# Patient Record
Sex: Female | Born: 1960 | Race: White | Hispanic: No | Marital: Married | Smoking: Former smoker
Health system: Southern US, Community
[De-identification: ages and names within clinical notes are randomized; demographics above are authoritative.]

## PROBLEM LIST (undated history)

## (undated) DIAGNOSIS — K219 Gastro-esophageal reflux disease without esophagitis: Secondary | ICD-10-CM

## (undated) DIAGNOSIS — I10 Essential (primary) hypertension: Secondary | ICD-10-CM

## (undated) HISTORY — PX: ABDOMINAL HYSTERECTOMY: SHX81

## (undated) HISTORY — DX: Gastro-esophageal reflux disease without esophagitis: K21.9

## (undated) HISTORY — PX: TUBAL LIGATION: SHX77

## (undated) HISTORY — PX: TOTAL ABDOMINAL HYSTERECTOMY: SHX209

## (undated) HISTORY — DX: Essential (primary) hypertension: I10

---

## 2007-03-06 ENCOUNTER — Ambulatory Visit: Payer: Self-pay | Admitting: Obstetrics and Gynecology

## 2007-07-23 ENCOUNTER — Ambulatory Visit: Payer: Self-pay | Admitting: Obstetrics and Gynecology

## 2007-12-27 ENCOUNTER — Other Ambulatory Visit: Payer: Self-pay

## 2007-12-27 ENCOUNTER — Ambulatory Visit: Payer: Self-pay | Admitting: Obstetrics and Gynecology

## 2007-12-31 ENCOUNTER — Inpatient Hospital Stay: Payer: Self-pay | Admitting: Obstetrics and Gynecology

## 2009-04-18 IMAGING — US US PELV - US TRANSVAGINAL
1 series · 17 of 25 positions shown · non-contrast
Comparison: none

REASON FOR EXAM: adnexal mass
COMMENTS:

[Series 1: us pelv - us transvaginal · 17 of 34 slices shown]
[im 1/34]
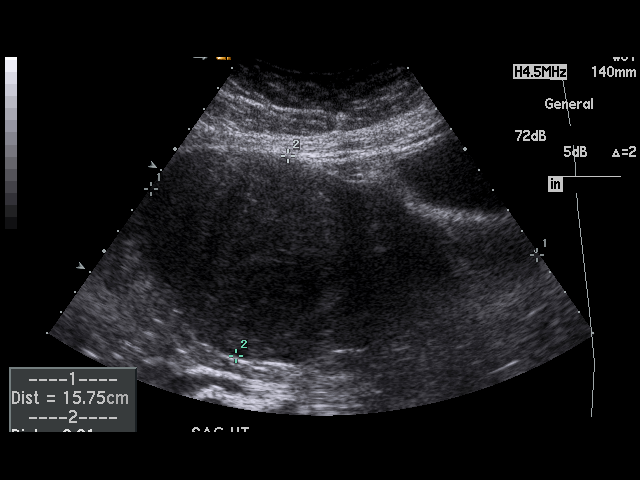
[im 3/34]
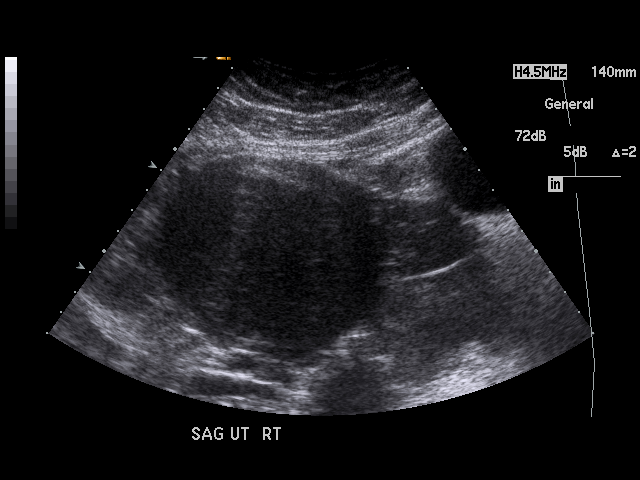
[im 5/34]
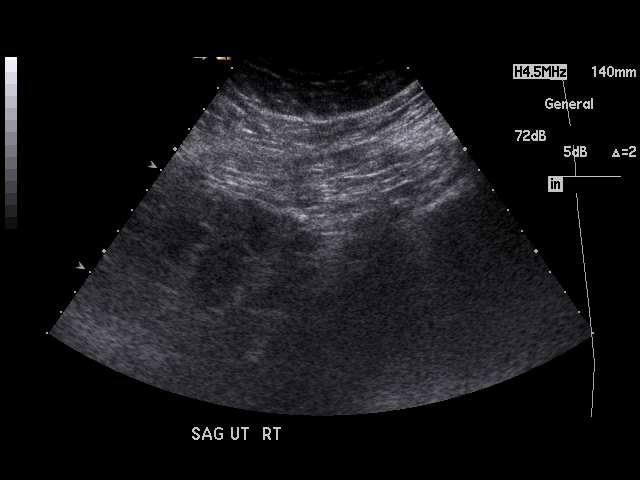
[im 7/34]
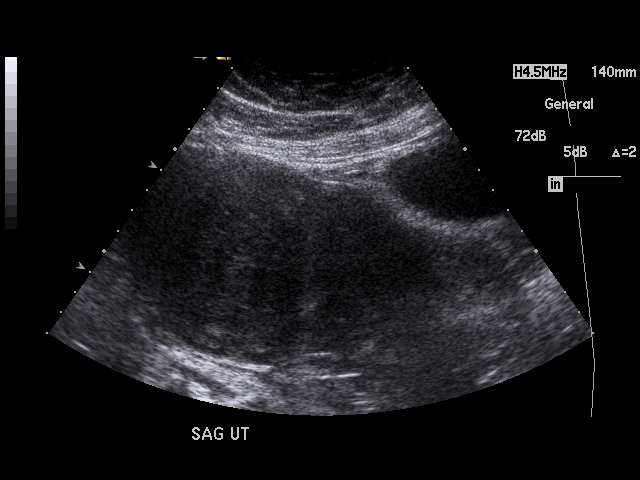
[im 9/34]
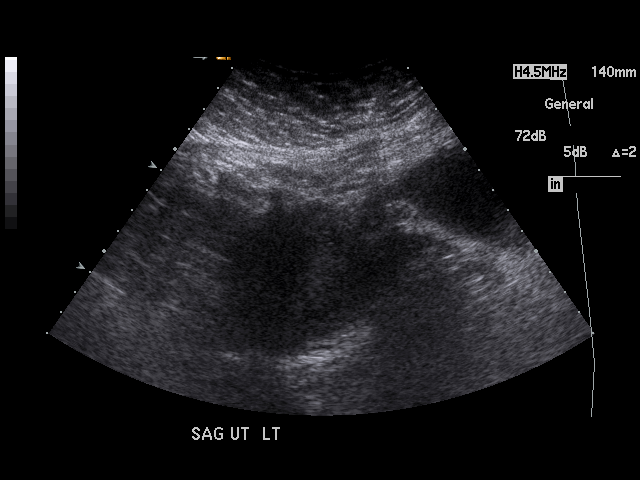
[im 12/34]
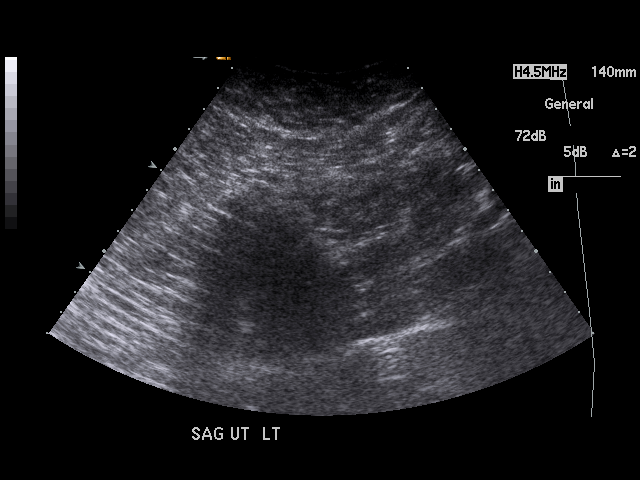
[im 13/34]
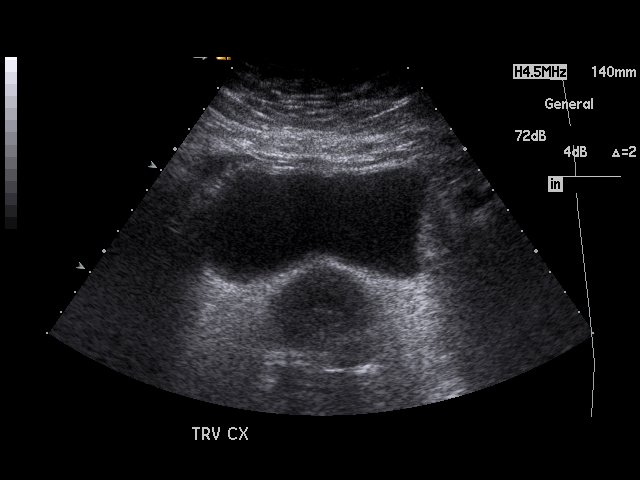
[im 16/34]
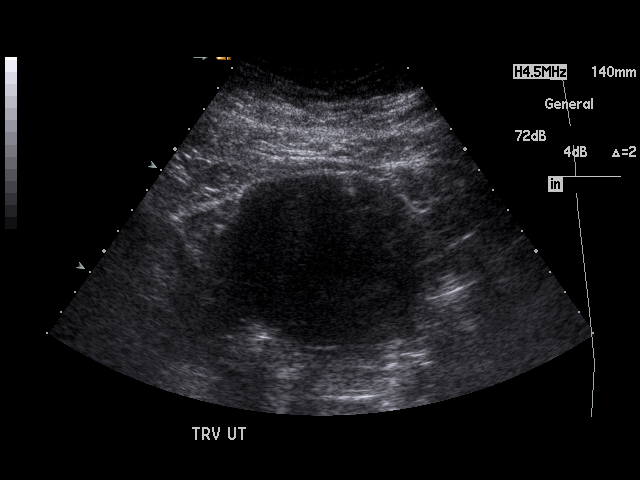
[im 17/34]
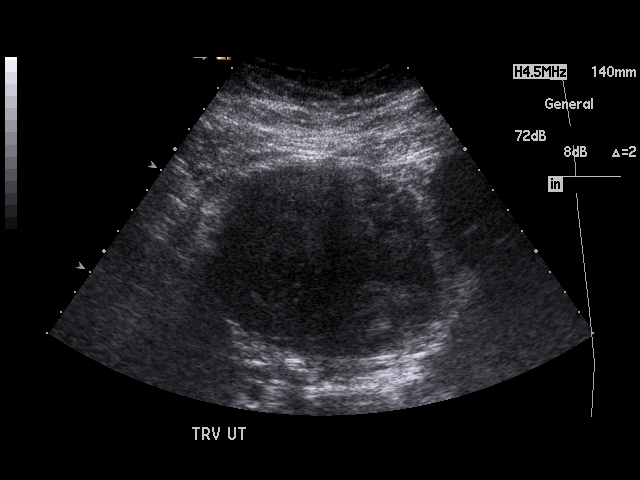
[im 18/34]
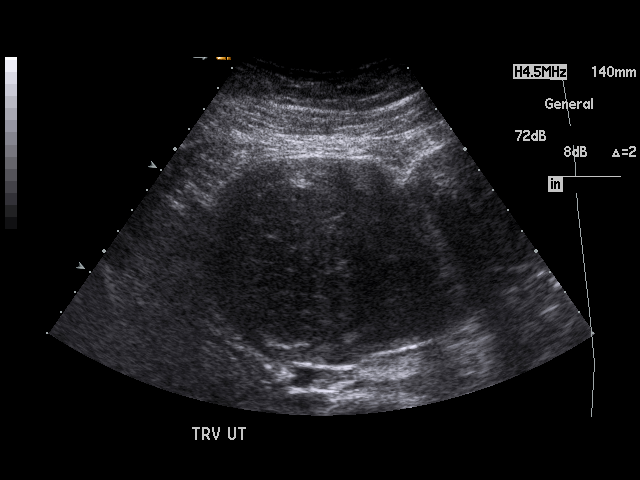
[im 21/34]
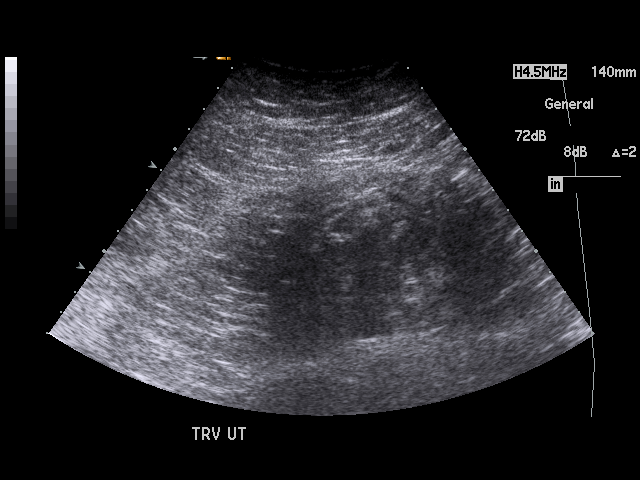
[im 23/34]
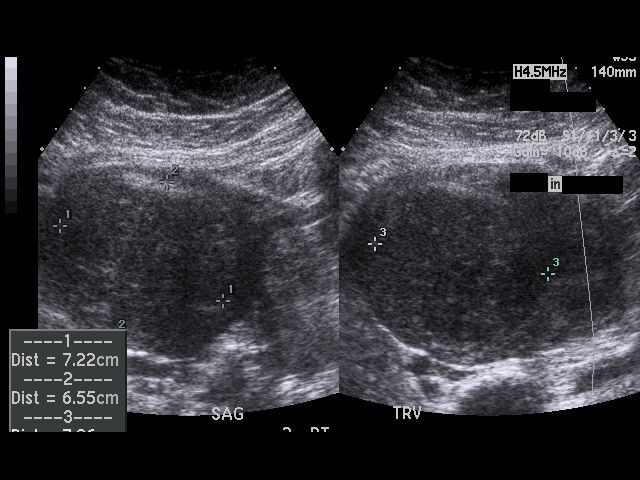
[im 25/34]
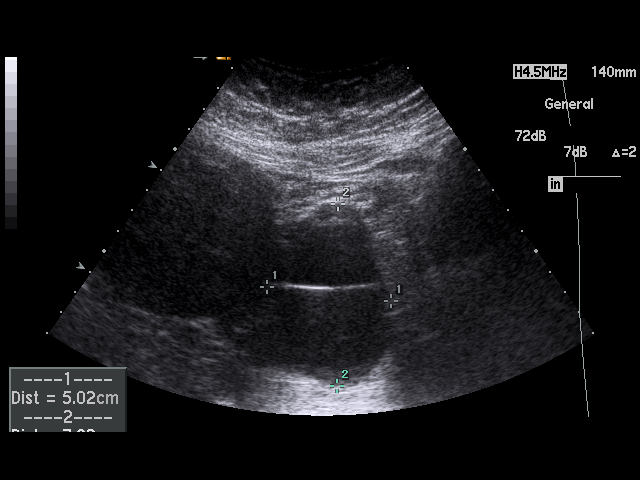
[im 27/34]
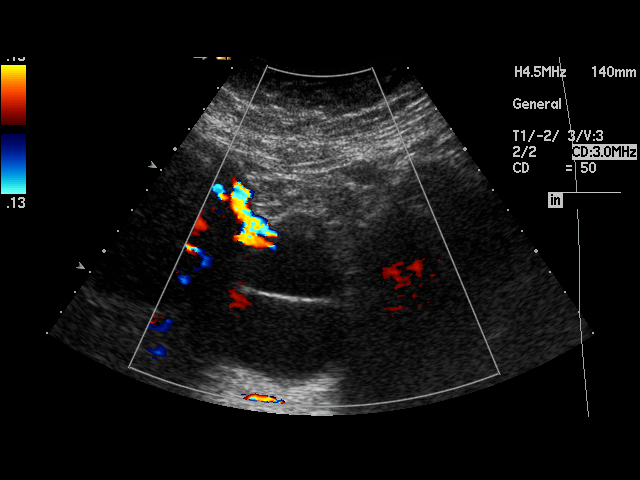
[im 29/34]
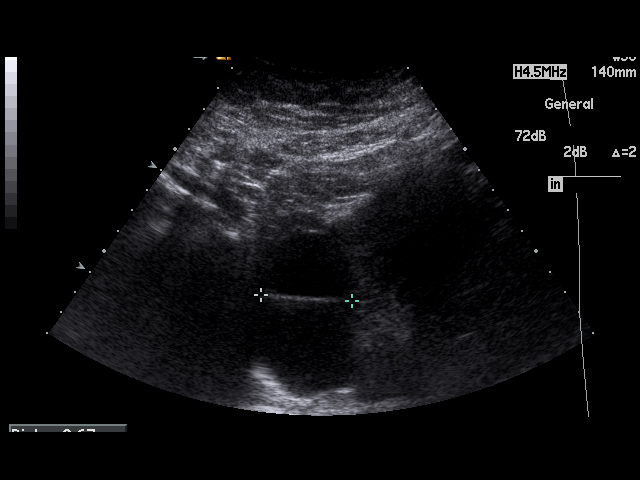
[im 31/34]
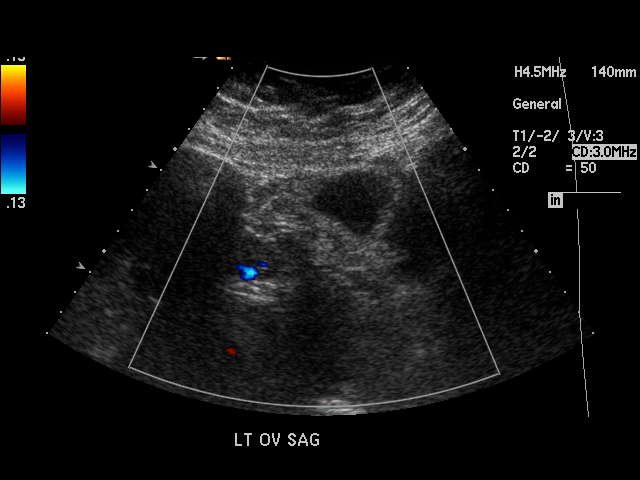
[im 34/34]
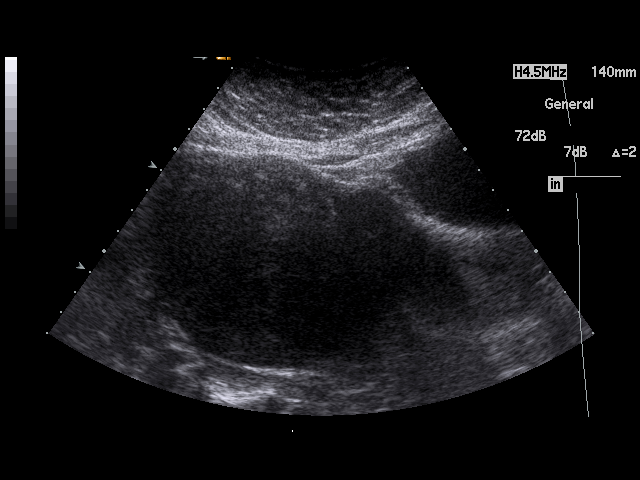

[17 of 25 positions shown; findings below may reference images not displayed]

PROCEDURE:     US  - US PELVIS MASS EXAM  - [DATE] [DATE] [DATE]  [DATE]

RESULT:     The uterus measures 15.75 cm x 8.31 cm x 8.00 cm. There are
noted three uterine masses consistent with uterine fibroids. The largest
measures 7.22 cm at maximum diameter and is off the posterior aspect of the
body and fundus. The second largest measures 6.54 cm and is off the anterior
aspect of the body. The third measures 5.3 cm and is at the fundus on the
RIGHT. The endometrium measures 1.04 cm in thickness.

The RIGHT and LEFT ovaries are visualized. The RIGHT ovary measures 7.32 cm
at maximum diameter. Within the RIGHT ovary there is a 6.56 cm septated
cyst. The LEFT ovary is normal in appearance and measures 2.8 cm at maximum
diameter. No ascites is seen.
IMPRESSION: 1. The uterus is enlarged and contains three hypoechoic masses consistent
with uterine fibroids.
2. There is a septated RIGHT ovarian cyst.
3. No free fluid is noted in the pelvis.

## 2009-09-04 IMAGING — US US PELV - US TRANSVAGINAL
1 series · 17 of 25 positions shown · non-contrast
Comparison: none

REASON FOR EXAM: f/u on ovarian cyst
COMMENTS:

[Series 1: us pelv - us transvaginal · 17 of 41 slices shown]
[im 1/41]
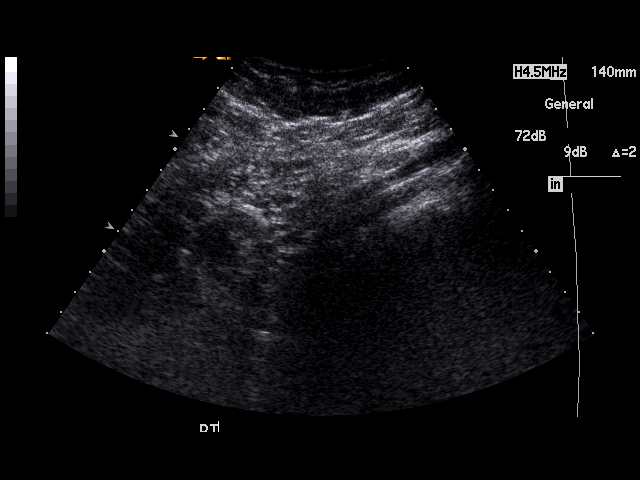
[im 4/41]
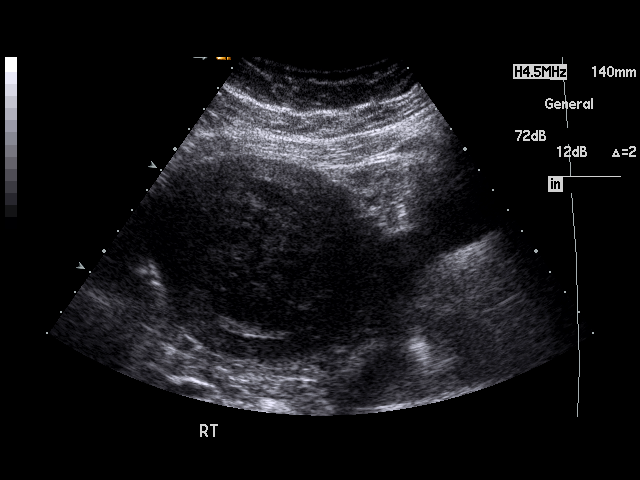
[im 6/41]
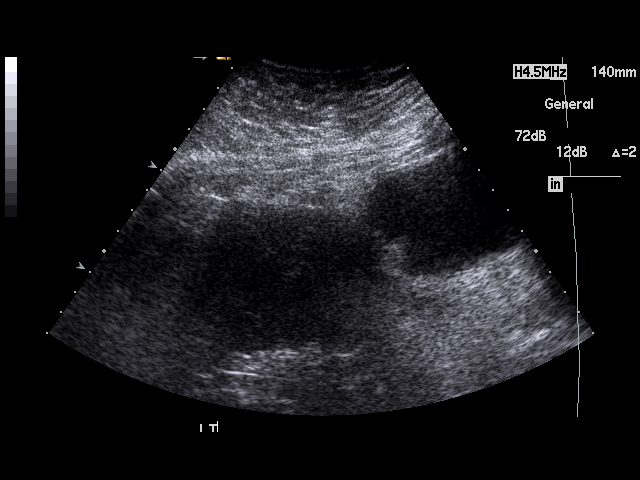
[im 9/41]
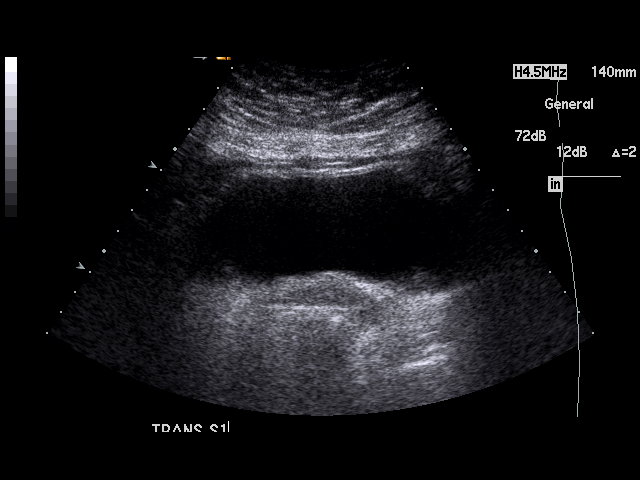
[im 11/41]
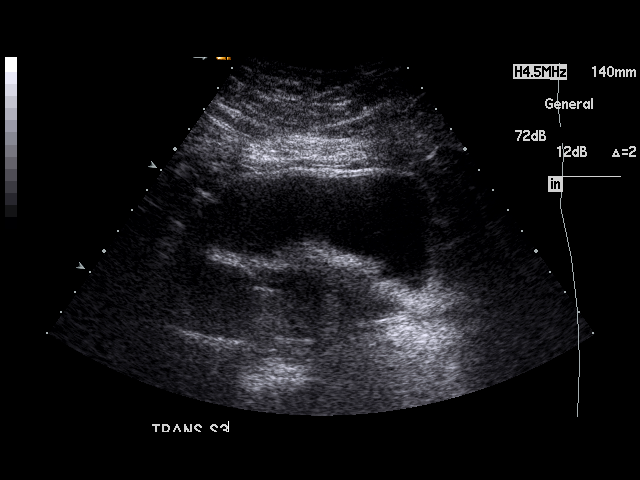
[im 14/41]
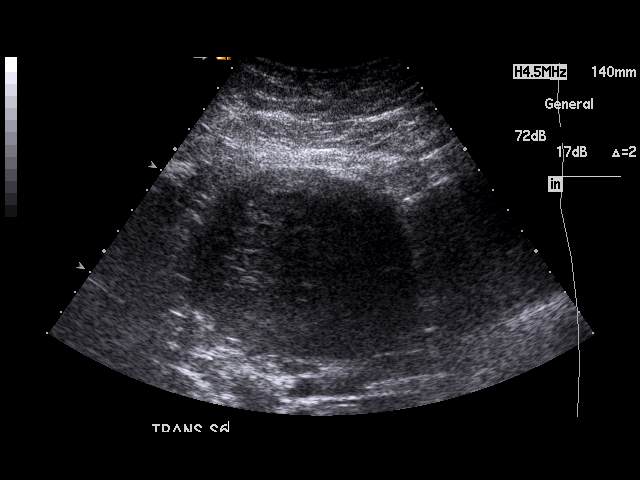
[im 16/41]
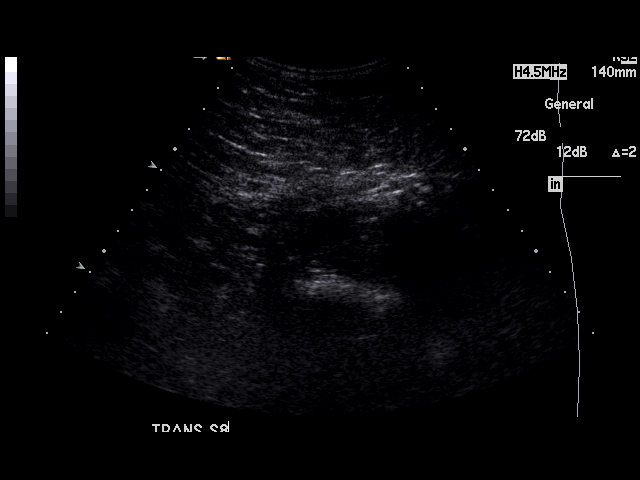
[im 19/41]
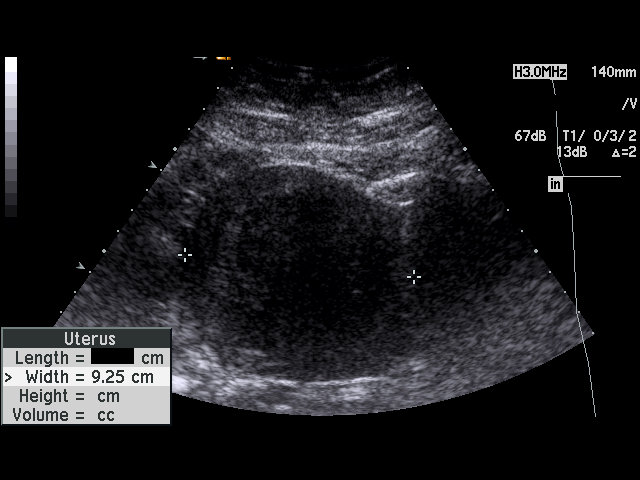
[im 21/41]
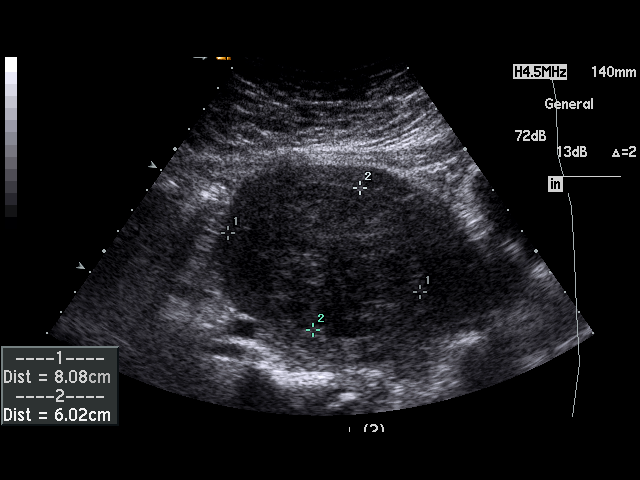
[im 22/41]
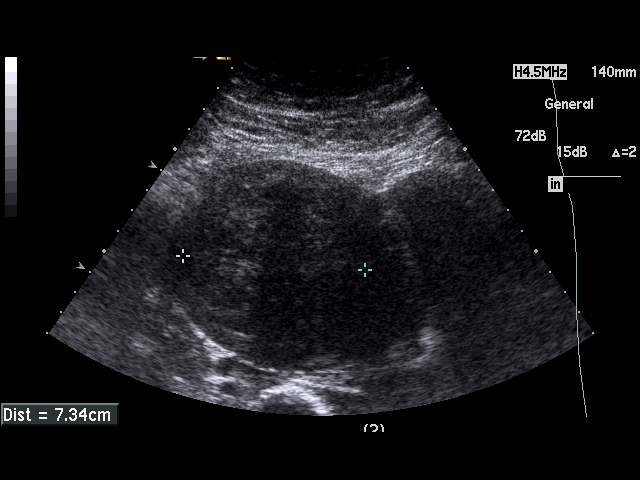
[im 26/41]
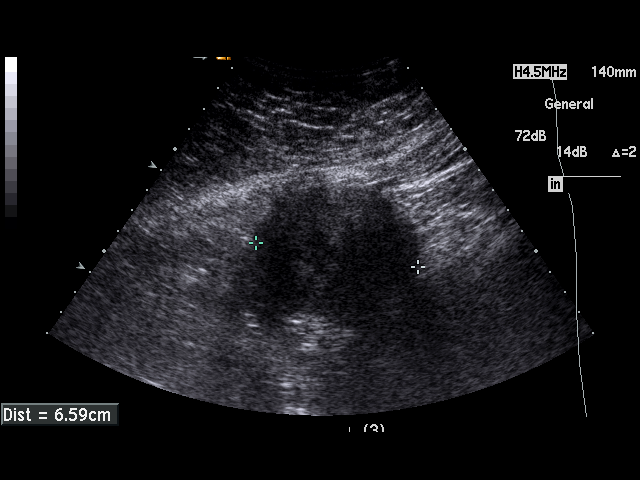
[im 27/41]
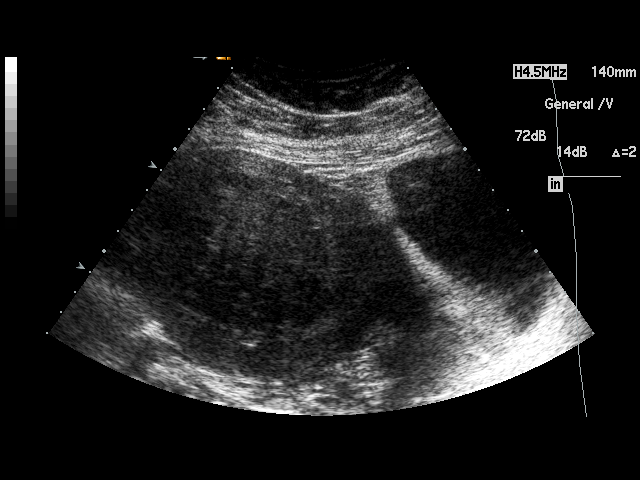
[im 31/41]
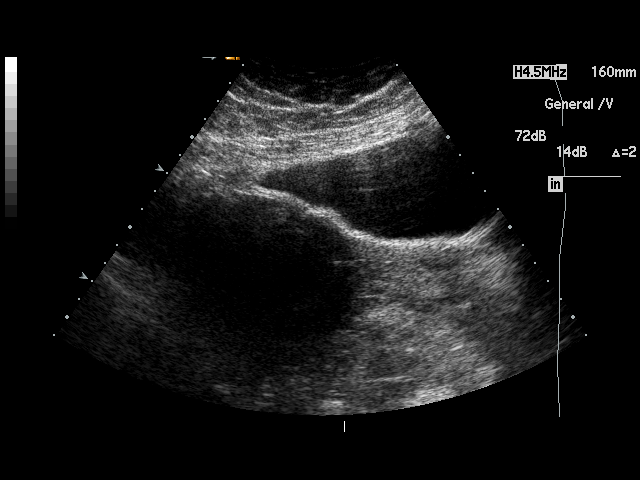
[im 32/41]
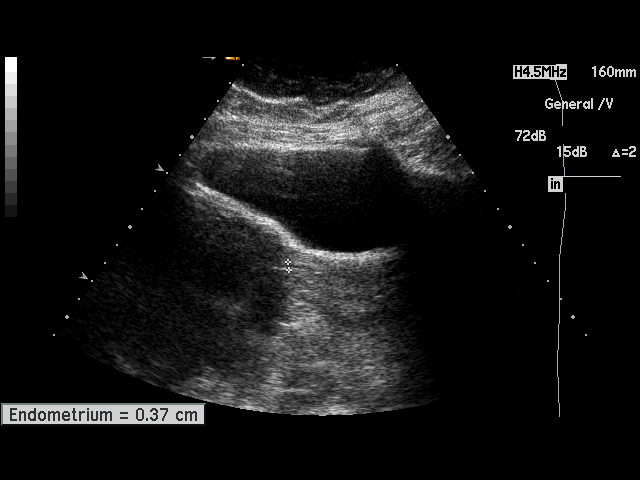
[im 36/41]
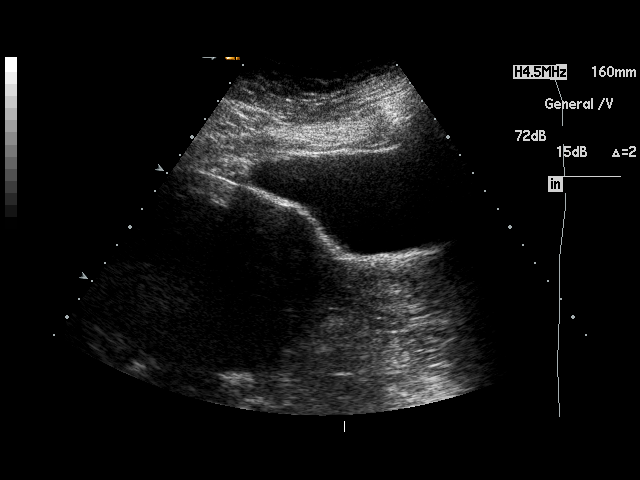
[im 37/41]
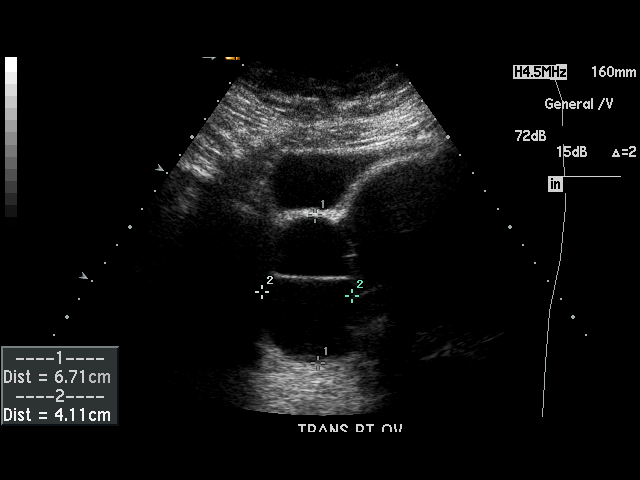
[im 41/41]
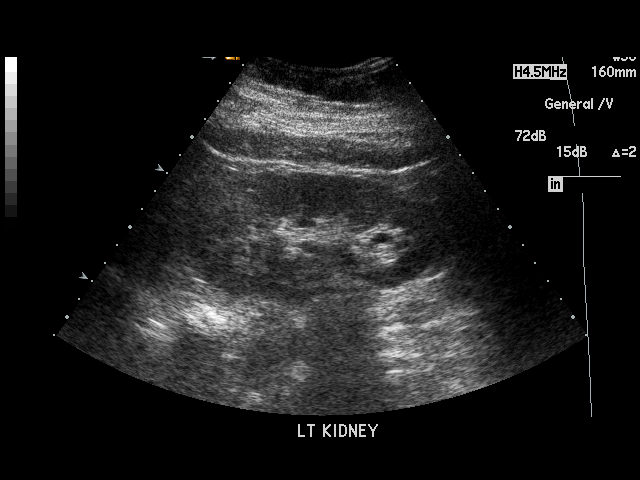

[17 of 25 positions shown; findings below may reference images not displayed]

PROCEDURE:     US  - US PELVIS MASS EXAM  - [DATE]  [DATE] [DATE] [DATE]

RESULT:     Sonographic evaluation of the pelvis is performed
transabdominally. The patient requested endovaginal scanning not be
performed. Comparison is made to the previous exam dated 03/06/2007.

The uterus measures 15.3 x 9.2 x 8.7 cm and contains three hypoechoic masses
suggestive of fibroids. The largest of these has a maximal measured diameter
of 8.0 cm which is slightly larger than that seen previously. This lesion is
off of the posterior aspect of the uterine fundus and body. The endometrial
stripe thickness is 3.7 mm. The LEFT ovary measures 2.1 x 1.7 x 1.2 cm.

The RIGHT ovary contains a septated 4.5 x 4.3 x 5.5 cm cystic structure.
There is no free fluid. There is no hydronephrosis. The kidneys appear to be
grossly normal. When compared to previous exam, the RIGHT ovarian cyst
measures slightly smaller.
IMPRESSION: 1. Persistent septated cyst in the RIGHT ovary which is worrisome because of
its size. Continued gynecologic consultation is suggested.
2. Multiple uterine fibroids which appear to be relatively stable in size.

## 2010-02-08 IMAGING — CR DG CHEST 2V
1 series · 2 of 2 positions shown · non-contrast
Comparison: none

REASON FOR EXAM: Hypertension
COMMENTS:

PROCEDURE:     DXR - DXR CHEST PA (OR AP) AND LATERAL  - December 27, 2007  [DATE]
RESULT:     The lung fields are clear. No pneumonia, pneumothorax or pleural
effusion is seen. The heart size is normal. The mediastinal and osseous
structures reveal no significant abnormalities.

[Series 1: view not recorded · 0.17mm/px · 2 of 2 slices shown]
[im 1/2]
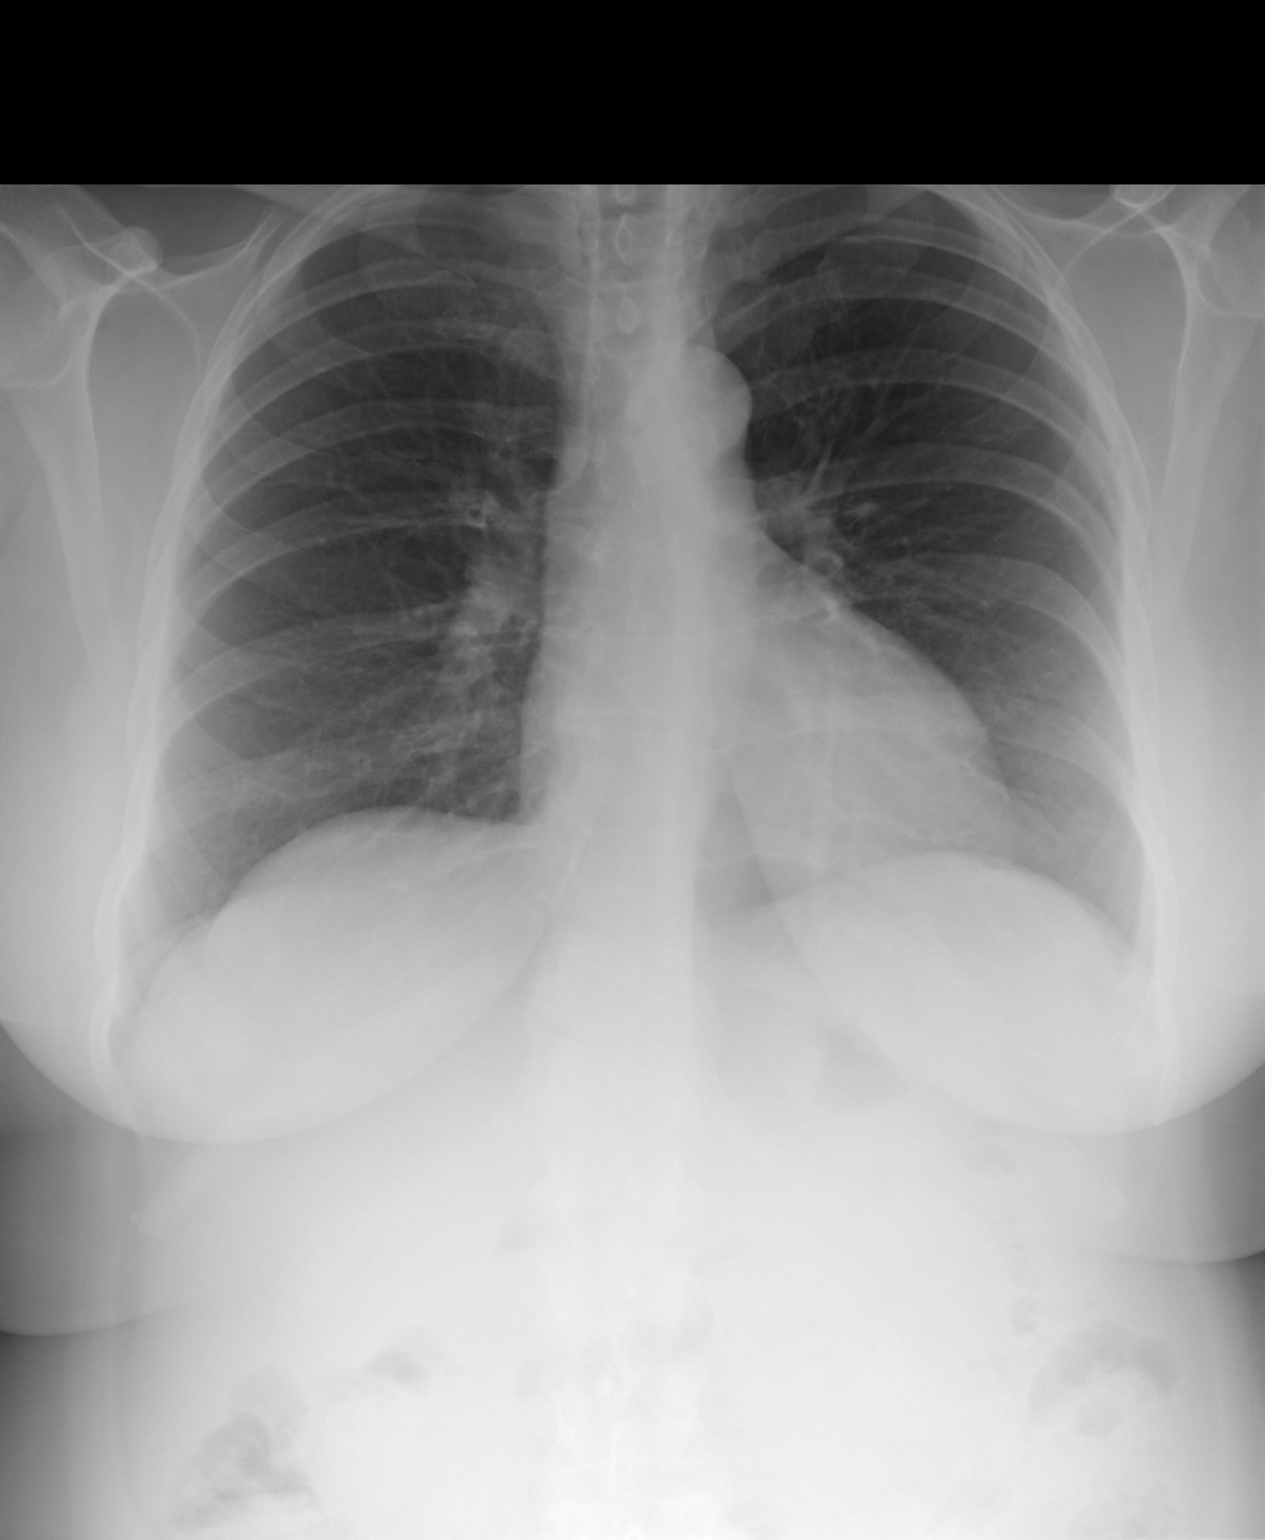
[im 2/2]
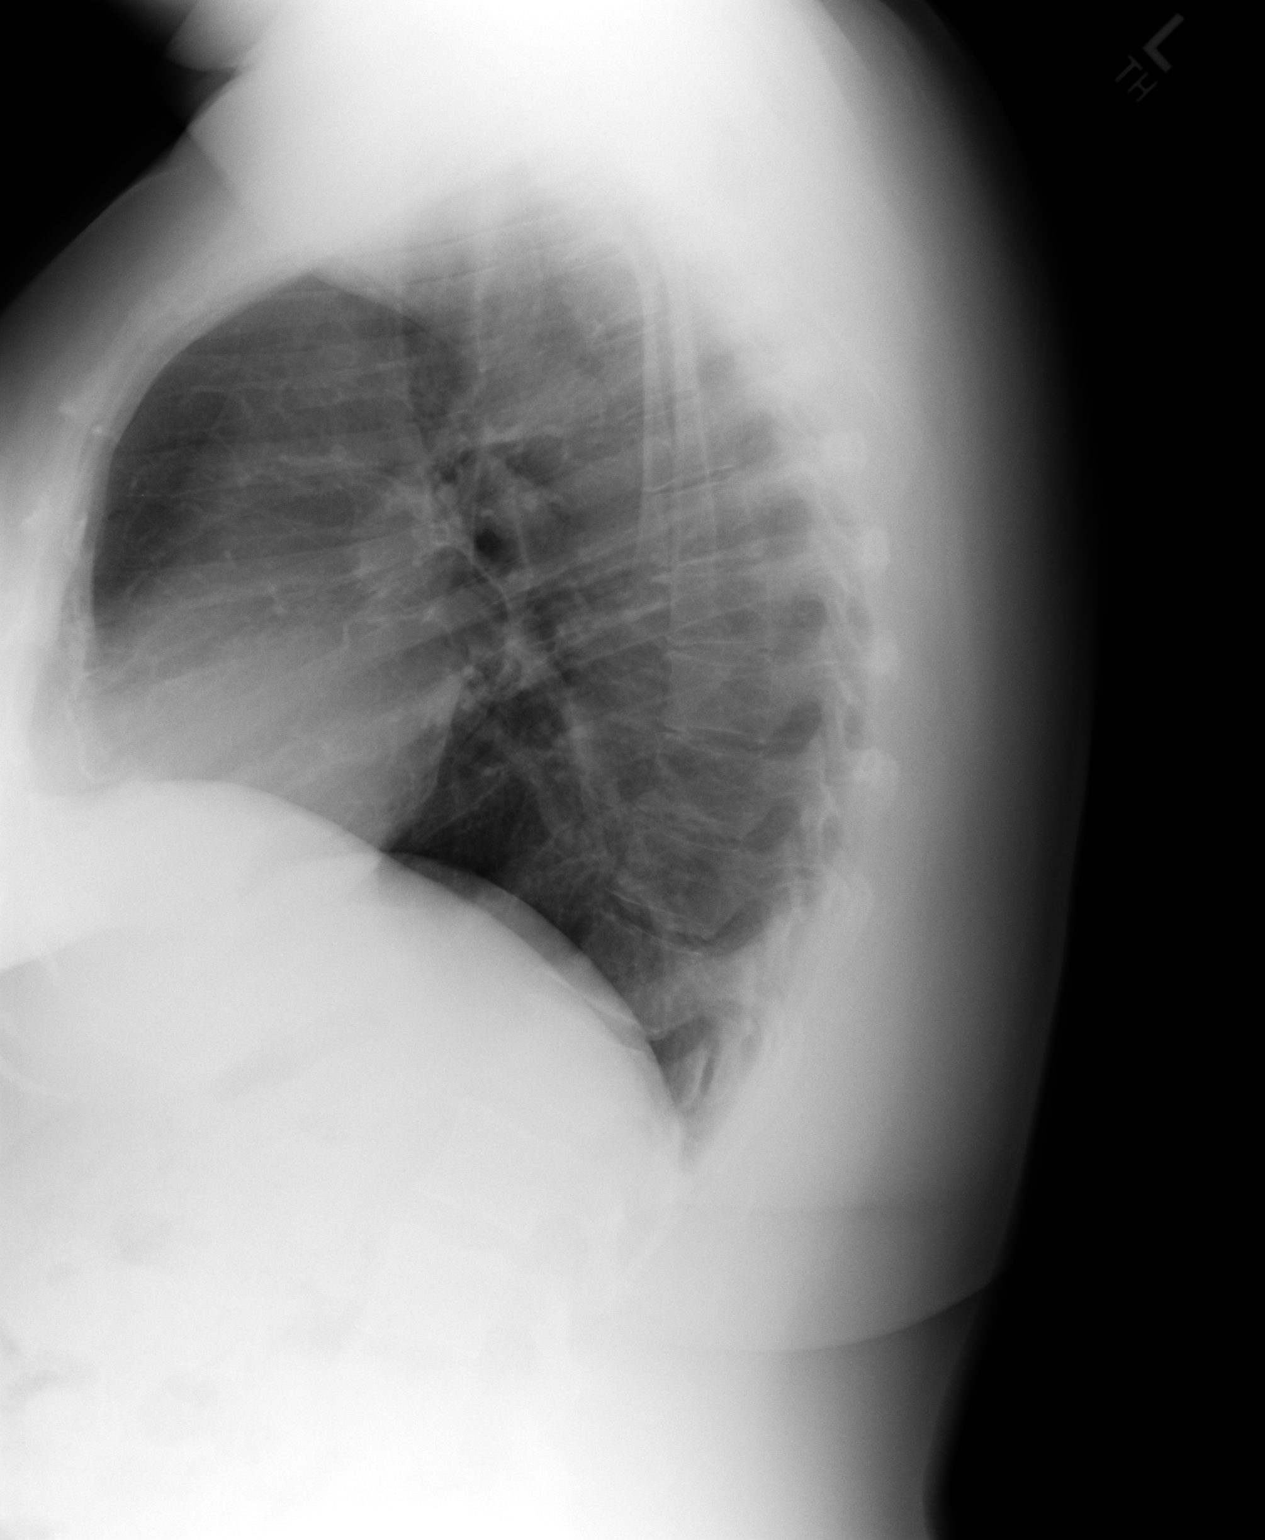

[2 of 2 positions shown; findings below may reference images not displayed]

IMPRESSION: No significant abnormalities are noted.

## 2015-03-04 ENCOUNTER — Other Ambulatory Visit: Payer: Self-pay | Admitting: Internal Medicine

## 2015-03-04 ENCOUNTER — Encounter: Payer: Self-pay | Admitting: Internal Medicine

## 2015-03-04 DIAGNOSIS — R945 Abnormal results of liver function studies: Secondary | ICD-10-CM | POA: Insufficient documentation

## 2015-03-04 DIAGNOSIS — R600 Localized edema: Secondary | ICD-10-CM | POA: Insufficient documentation

## 2015-03-04 DIAGNOSIS — R7989 Other specified abnormal findings of blood chemistry: Secondary | ICD-10-CM | POA: Insufficient documentation

## 2015-03-04 DIAGNOSIS — K7581 Nonalcoholic steatohepatitis (NASH): Secondary | ICD-10-CM | POA: Insufficient documentation

## 2015-03-04 DIAGNOSIS — I10 Essential (primary) hypertension: Secondary | ICD-10-CM | POA: Insufficient documentation

## 2015-03-04 MED ORDER — HYDROCHLOROTHIAZIDE 25 MG PO TABS: 25.0000 mg | ORAL_TABLET | Freq: Every day | ORAL | Status: DC

## 2015-04-08 ENCOUNTER — Ambulatory Visit (INDEPENDENT_AMBULATORY_CARE_PROVIDER_SITE_OTHER): Payer: 59 | Admitting: Internal Medicine

## 2015-04-08 ENCOUNTER — Encounter: Payer: Self-pay | Admitting: Internal Medicine

## 2015-04-08 VITALS — BP 110/80 | HR 80 | Ht 65.0 in | Wt 182.2 lb

## 2015-04-08 DIAGNOSIS — E785 Hyperlipidemia, unspecified: Secondary | ICD-10-CM | POA: Diagnosis not present

## 2015-04-08 DIAGNOSIS — Z0000 Encounter for general adult medical examination without abnormal findings: Secondary | ICD-10-CM

## 2015-04-08 DIAGNOSIS — R600 Localized edema: Secondary | ICD-10-CM

## 2015-04-08 DIAGNOSIS — Z1239 Encounter for other screening for malignant neoplasm of breast: Secondary | ICD-10-CM

## 2015-04-08 DIAGNOSIS — I10 Essential (primary) hypertension: Secondary | ICD-10-CM

## 2015-04-08 DIAGNOSIS — K219 Gastro-esophageal reflux disease without esophagitis: Secondary | ICD-10-CM | POA: Diagnosis not present

## 2015-04-08 DIAGNOSIS — Z1211 Encounter for screening for malignant neoplasm of colon: Secondary | ICD-10-CM

## 2015-04-08 LAB — POCT URINALYSIS DIPSTICK
Bilirubin, UA: NEGATIVE
Glucose, UA: NEGATIVE
KETONES UA: NEGATIVE
Leukocytes, UA: NEGATIVE
Nitrite, UA: NEGATIVE
PH UA: 6
Protein, UA: NEGATIVE
RBC UA: NEGATIVE
Urobilinogen, UA: 0.2

## 2015-04-08 NOTE — Progress Notes (Signed)
Date:  04/08/2015   Name:  Sherri Cox   DOB:  05-19-1961   MRN:  [id number]   Chief Complaint: Annual Exam; Hypertension; and Gastroesophageal Reflux Sherri Cox is a 54 y.o. female who presents today for her Complete Annual Exam. She feels well. She reports exercising at the gym several times per week. She reports she is sleeping fairly well. Mammogram is scheduled.  No breast problems. She did not get colonoscopy done due to scheduling issues.  She is ready to proceed now.  Hypertension Patient has been on hydrochlorothiazide daily for control of blood pressure. She feels well without cramps chest pains or shortness of breath. She has minimal ankle edema.  Gastroesophageal reflux -patient has intermittent heartburn without vomiting or change in bowel habits. She takes Nexium one or 2 times per week for more severe symptoms.  Hyperlipidemia -currently on no medication. She continues a healthy low-fat diet and exercises regularly. Her weight remains stable.   Review of Systems  Constitutional: Negative for chills, diaphoresis and fatigue.  HENT: Negative for hearing loss, rhinorrhea, sore throat and tinnitus.   Eyes: Negative for visual disturbance.  Respiratory: Negative for choking, shortness of breath and wheezing.   Cardiovascular: Negative for chest pain, palpitations and leg swelling.  Gastrointestinal: Negative for abdominal pain, diarrhea and blood in stool.  Endocrine: Negative for polydipsia and polyuria.  Genitourinary: Negative for dysuria, hematuria and pelvic pain.  Musculoskeletal: Positive for arthralgias. Negative for back pain and joint swelling.  Skin: Negative for color change and rash.  Neurological: Negative for light-headedness and headaches.  Psychiatric/Behavioral: Negative for sleep disturbance and dysphoric mood.    Patient Active Problem List   Diagnosis Date Noted  . Hyperlipidemia, mild 04/08/2015  . Essential (primary) hypertension 03/04/2015  .  Acid reflux 03/04/2015  . Local edema 03/04/2015  . Abnormal LFTs 03/04/2015    Prior to Admission medications   Medication Sig Start Date End Date Taking? Authorizing Provider  esomeprazole (NEXIUM 24HR) 20 MG capsule Take 1 capsule by mouth daily.   Yes Historical Provider, MD  hydrochlorothiazide (HYDRODIURIL) 25 MG tablet Take 1 tablet (25 mg total) by mouth daily. 03/04/15  Yes Reubin MilanLaura H Aric Jost, MD    No Known Allergies  Past Surgical History  Procedure Laterality Date  . Abdominal hysterectomy      Right ovary remains  . Tubal ligation      Social History  Substance Use Topics  . Smoking status: Former Games developermoker  . Smokeless tobacco: None  . Alcohol Use: 1.2 oz/week    2 Standard drinks or equivalent per week     Medication list has been reviewed and updated.   Physical Exam  Constitutional: She is oriented to person, place, and time. She appears well-developed and well-nourished. No distress.  HENT:  Head: Normocephalic and atraumatic.  Right Ear: Tympanic membrane and ear canal normal.  Left Ear: Tympanic membrane and ear canal normal.  Nose: Right sinus exhibits no maxillary sinus tenderness. Left sinus exhibits no maxillary sinus tenderness.  Mouth/Throat: Uvula is midline and oropharynx is clear and moist.  Eyes: Conjunctivae and EOM are normal. Right eye exhibits no discharge. Left eye exhibits no discharge. No scleral icterus.  Neck: Normal range of motion. Carotid bruit is not present. No erythema present. No thyromegaly present.  Cardiovascular: Normal rate, regular rhythm, normal heart sounds and normal pulses.   Pulmonary/Chest: Effort normal and breath sounds normal. No respiratory distress. She has no wheezes. Right breast exhibits no mass,  no nipple discharge, no skin change and no tenderness. Left breast exhibits no mass, no nipple discharge, no skin change and no tenderness.  Abdominal: Soft. Bowel sounds are normal. There is no hepatosplenomegaly. There  is no tenderness. There is no CVA tenderness.  Musculoskeletal: Normal range of motion.       Right knee: Normal.       Left knee: Normal.  Lymphadenopathy:    She has no cervical adenopathy.    She has no axillary adenopathy.  Neurological: She is alert and oriented to person, place, and time. She has normal reflexes. No cranial nerve deficit or sensory deficit.  Skin: Skin is warm, dry and intact. No rash noted.  Psychiatric: She has a normal mood and affect. Her speech is normal and behavior is normal. Thought content normal.  Nursing note and vitals reviewed.   BP 110/80 mmHg  Pulse 80  Ht  (1.651 m)  Wt 182 lb 3.2 oz (82.645 kg)  BMI 30.32 kg/m2  Assessment and Plan: 1. Annual physical exam Patient encouraged to have a dermatology skin evaluation She declined flu vaccine - POCT urinalysis dipstick  2. Essential (primary) hypertension Controlled; continue current medication - CBC with Differential/Platelet - Comprehensive metabolic panel  3. Gastroesophageal reflux disease without esophagitis Stable symptoms on intermittent dosing of Nexium - CBC with Differential/Platelet  4. Hyperlipidemia, mild Will check labs and advise Continue low-fat diet and exercise - Lipid panel  5. Local edema Controlled - TSH  6. Breast cancer screening Mammogram is scheduled next month at [facility] imaging  7. Colon cancer screening Refer for screening colonoscopy - Ambulatory referral to Gastroenterology   Bari Edward, MD [facility]  04/08/2015

## 2015-04-08 NOTE — Patient Instructions (Signed)
Breast Self-Awareness Practicing breast self-awareness may pick up problems early, prevent significant medical complications, and possibly save your life. By practicing breast self-awareness, you can become familiar with how your breasts look and feel and if your breasts are changing. This allows you to notice changes early. It can also offer you some reassurance that your breast health is good. One way to learn what is normal for your breasts and whether your breasts are changing is to do a breast self-exam. If you find a lump or something that was not present in the past, it is best to contact your caregiver right away. Other findings that should be evaluated by your caregiver include nipple discharge, especially if it is bloody; skin changes or reddening; areas where the skin seems to be pulled in (retracted); or new lumps and bumps. Breast pain is seldom associated with cancer (malignancy), but should also be evaluated by a caregiver. HOW TO PERFORM A BREAST SELF-EXAM The best time to examine your breasts is 5-7 days after your menstrual period is over. During menstruation, the breasts are lumpier, and it may be more difficult to pick up changes. If you do not menstruate, have reached menopause, or had your uterus removed (hysterectomy), you should examine your breasts at regular intervals, such as monthly. If you are breastfeeding, examine your breasts after a feeding or after using a breast pump. Breast implants do not decrease the risk for lumps or tumors, so continue to perform breast self-exams as recommended. Talk to your caregiver about how to determine the difference between the implant and breast tissue. Also, talk about the amount of pressure you should use during the exam. Over time, you will become more familiar with the variations of your breasts and more comfortable with the exam. A breast self-exam requires you to remove all your clothes above the waist. 1. Look at your breasts and nipples.  Stand in front of a mirror in a room with good lighting. With your hands on your hips, push your hands firmly downward. Look for a difference in shape, contour, and size from one breast to the other (asymmetry). Asymmetry includes puckers, dips, or bumps. Also, look for skin changes, such as reddened or scaly areas on the breasts. Look for nipple changes, such as discharge, dimpling, repositioning, or redness. 2. Carefully feel your breasts. This is best done either in the shower or tub while using soapy water or when flat on your back. Place the arm (on the side of the breast you are examining) above your head. Use the pads (not the fingertips) of your three middle fingers on your opposite hand to feel your breasts. Start in the underarm area and use  inch (2 cm) overlapping circles to feel your breast. Use 3 different levels of pressure (light, medium, and firm pressure) at each circle before moving to the next circle. The light pressure is needed to feel the tissue closest to the skin. The medium pressure will help to feel breast tissue a little deeper, while the firm pressure is needed to feel the tissue close to the ribs. Continue the overlapping circles, moving downward over the breast until you feel your ribs below your breast. Then, move one finger-width towards the center of the body. Continue to use the  inch (2 cm) overlapping circles to feel your breast as you move slowly up toward the collar bone (clavicle) near the base of the neck. Continue the up and down exam using all 3 pressures until you reach the   middle of the chest. Do this with each breast, carefully feeling for lumps or changes. 3.  Keep a written record with breast changes or normal findings for each breast. By writing this information down, you do not need to depend only on memory for size, tenderness, or location. Write down where you are in your menstrual cycle, if you are still menstruating. Breast tissue can have some lumps or  thick tissue. However, see your caregiver if you find anything that concerns you.  SEEK MEDICAL CARE IF:  You see a change in shape, contour, or size of your breasts or nipples.   You see skin changes, such as reddened or scaly areas on the breasts or nipples.   You have an unusual discharge from your nipples.   You feel a new lump or unusually thick areas.    This information is not intended to replace advice given to you by your health care provider. Make sure you discuss any questions you have with your health care provider.   Document Released: 06/06/2005 Document Revised: 05/23/2012 Document Reviewed: 09/21/2011 Elsevier Interactive Patient Education 2016 Elsevier Inc[employer]  

## 2015-04-09 LAB — COMPREHENSIVE METABOLIC PANEL
ALBUMIN: 4.6 g/dL (ref 3.5–5.5)
ALT: 78 IU/L — ABNORMAL HIGH (ref 0–32)
AST: 76 IU/L — ABNORMAL HIGH (ref 0–40)
Albumin/Globulin Ratio: 1.4 (ref 1.1–2.5)
Alkaline Phosphatase: 64 IU/L (ref 39–117)
BUN / CREAT RATIO: 14 (ref 9–23)
BUN: 9 mg/dL (ref 6–24)
Bilirubin Total: 0.7 mg/dL (ref 0.0–1.2)
CALCIUM: 9.6 mg/dL (ref 8.7–10.2)
CO2: 21 mmol/L (ref 18–29)
CREATININE: 0.65 mg/dL (ref 0.57–1.00)
Chloride: 95 mmol/L — ABNORMAL LOW (ref 97–106)
GFR calc non Af Amer: 100 mL/min/{1.73_m2} (ref >59)
GFR, EST AFRICAN AMERICAN: 116 mL/min/{1.73_m2} (ref >59)
Globulin, Total: 3.3 g/dL (ref 1.5–4.5)
Glucose: 101 mg/dL — ABNORMAL HIGH (ref 65–99)
Potassium: 4.4 mmol/L (ref 3.5–5.2)
Sodium: 141 mmol/L (ref 136–144)
TOTAL PROTEIN: 7.9 g/dL (ref 6.0–8.5)

## 2015-04-09 LAB — LIPID PANEL
Chol/HDL Ratio: 2.3 ratio units (ref 0.0–4.4)
Cholesterol, Total: 239 mg/dL — ABNORMAL HIGH (ref 100–199)
HDL: 105 mg/dL (ref >39)
LDL Calculated: 113 mg/dL — ABNORMAL HIGH (ref 0–99)
Triglycerides: 103 mg/dL (ref 0–149)
VLDL CHOLESTEROL CAL: 21 mg/dL (ref 5–40)

## 2015-04-09 LAB — CBC WITH DIFFERENTIAL/PLATELET
Basophils Absolute: 0 10*3/uL (ref 0.0–0.2)
Basos: 1 %
EOS (ABSOLUTE): 0.2 10*3/uL (ref 0.0–0.4)
EOS: 4 %
Hematocrit: 41 % (ref 34.0–46.6)
Hemoglobin: 14.1 g/dL (ref 11.1–15.9)
IMMATURE GRANULOCYTES: 0 %
Immature Grans (Abs): 0 10*3/uL (ref 0.0–0.1)
Lymphocytes Absolute: 2.3 10*3/uL (ref 0.7–3.1)
Lymphs: 36 %
MCH: 31.5 pg (ref 26.6–33.0)
MCHC: 34.4 g/dL (ref 31.5–35.7)
MCV: 92 fL (ref 79–97)
Monocytes Absolute: 0.3 10*3/uL (ref 0.1–0.9)
Monocytes: 5 %
NEUTROS PCT: 54 %
Neutrophils Absolute: 3.5 10*3/uL (ref 1.4–7.0)
Platelets: 269 10*3/uL (ref 150–379)
RBC: 4.48 x10E6/uL (ref 3.77–5.28)
RDW: 13 % (ref 12.3–15.4)
WBC: 6.3 10*3/uL (ref 3.4–10.8)

## 2015-04-09 LAB — TSH: TSH: 1.82 u[IU]/mL (ref 0.450–4.500)

## 2015-06-05 ENCOUNTER — Other Ambulatory Visit: Payer: Self-pay

## 2015-06-05 MED ORDER — HYDROCHLOROTHIAZIDE 25 MG PO TABS: 25.0000 mg | ORAL_TABLET | Freq: Every day | ORAL | Status: DC

## 2015-09-30 ENCOUNTER — Ambulatory Visit: Payer: Self-pay | Admitting: Internal Medicine

## 2015-11-27 ENCOUNTER — Other Ambulatory Visit: Payer: Self-pay | Admitting: Internal Medicine

## 2015-11-27 MED ORDER — HYDROCHLOROTHIAZIDE 25 MG PO TABS: 25.0000 mg | ORAL_TABLET | Freq: Every day | ORAL | Status: DC

## 2016-05-18 ENCOUNTER — Encounter: Payer: Self-pay | Admitting: Internal Medicine

## 2016-05-18 ENCOUNTER — Ambulatory Visit (INDEPENDENT_AMBULATORY_CARE_PROVIDER_SITE_OTHER): Payer: 59 | Admitting: Internal Medicine

## 2016-05-18 VITALS — BP 122/82 | HR 87 | Resp 16 | Ht 65.0 in | Wt 186.4 lb

## 2016-05-18 DIAGNOSIS — K219 Gastro-esophageal reflux disease without esophagitis: Secondary | ICD-10-CM | POA: Diagnosis not present

## 2016-05-18 DIAGNOSIS — I10 Essential (primary) hypertension: Secondary | ICD-10-CM | POA: Diagnosis not present

## 2016-05-18 DIAGNOSIS — E785 Hyperlipidemia, unspecified: Secondary | ICD-10-CM | POA: Diagnosis not present

## 2016-05-18 DIAGNOSIS — Z0000 Encounter for general adult medical examination without abnormal findings: Secondary | ICD-10-CM

## 2016-05-18 LAB — POCT URINALYSIS DIPSTICK
Bilirubin, UA: NEGATIVE
Blood, UA: NEGATIVE
Glucose, UA: NEGATIVE
Ketones, UA: NEGATIVE
Leukocytes, UA: NEGATIVE
NITRITE UA: NEGATIVE
PROTEIN UA: NEGATIVE
UROBILINOGEN UA: 0.2
pH, UA: 6

## 2016-05-18 MED ORDER — HYDROCHLOROTHIAZIDE 25 MG PO TABS: 25.0000 mg | ORAL_TABLET | Freq: Every day | ORAL | 1 refills | Status: DC

## 2016-05-18 NOTE — Patient Instructions (Signed)
Check on Cologuard coverage.  Call me with your choice of testing.  Breast Self-Awareness Introduction Breast self-awareness means being familiar with how your breasts look and feel. It involves checking your breasts regularly and reporting any changes to your health care provider. Practicing breast self-awareness is important. A change in your breasts can be a sign of a serious medical problem. Being familiar with how your breasts look and feel allows you to find any problems early, when treatment is more likely to be successful. All women should practice breast self-awareness, including women who have had breast implants. How to do a breast self-exam One way to learn what is normal for your breasts and whether your breasts are changing is to do a breast self-exam. To do a breast self-exam: Look for Changes  1. Remove all the clothing above your waist. 2. Stand in front of a mirror in a room with good lighting. 3. Put your hands on your hips. 4. Push your hands firmly downward. 5. Compare your breasts in the mirror. Look for differences between them (asymmetry), such as:  Differences in shape.  Differences in size.  Puckers, dips, and bumps in one breast and not the other. 6. Look at each breast for changes in your skin, such as:  Redness.  Scaly areas. 7. Look for changes in your nipples, such as:  Discharge.  Bleeding.  Dimpling.  Redness.  A change in position. Feel for Changes  Carefully feel your breasts for lumps and changes. It is best to do this while lying on your back on the floor and again while sitting or standing in the shower or tub with soapy water on your skin. Feel each breast in the following way:  Place the arm on the side of the breast you are examining above your head.  Feel your breast with the other hand.  Start in the nipple area and make  inch (2 cm) overlapping circles to feel your breast. Use the pads of your three middle fingers to do this.  Apply light pressure, then medium pressure, then firm pressure. The light pressure will allow you to feel the tissue closest to the skin. The medium pressure will allow you to feel the tissue that is a little deeper. The firm pressure will allow you to feel the tissue close to the ribs.  Continue the overlapping circles, moving downward over the breast until you feel your ribs below your breast.  Move one finger-width toward the center of the body. Continue to use the  inch (2 cm) overlapping circles to feel your breast as you move slowly up toward your collarbone.  Continue the up and down exam using all three pressures until you reach your armpit. Write Down What You Find  Write down what is normal for each breast and any changes that you find. Keep a written record with breast changes or normal findings for each breast. By writing this information down, you do not need to depend only on memory for size, tenderness, or location. Write down where you are in your menstrual cycle, if you are still menstruating. If you are having trouble noticing differences in your breasts, do not get discouraged. With time you will become more familiar with the variations in your breasts and more comfortable with the exam. How often should I examine my breasts? Examine your breasts every month. If you are breastfeeding, the best time to examine your breasts is after a feeding or after using a breast pump. If you  menstruate, the best time to examine your breasts is 5-7 days after your period is over. During your period, your breasts are lumpier, and it may be more difficult to notice changes. When should I see my health care provider? See your health care provider if you notice:  A change in shape or size of your breasts or nipples.  A change in the skin of your breast or nipples, such as a reddened or scaly area.  Unusual discharge from your nipples.  A lump or thick area that was not there before.  Pain in  your breasts.  Anything that concerns you. This information is not intended to replace advice given to you by your health care provider. Make sure you discuss any questions you have with your health care provider. Document Released: 06/06/2005 Document Revised: 11/12/2015 Document Reviewed: 04/26/2015  2017 Elsevier

## 2016-05-18 NOTE — Progress Notes (Signed)
Date:  05/18/2016   Name:  Sherri Cox   DOB:  June 08, 1961   MRN:  [id number]   Chief Complaint: Annual Exam Sherri Cox is a 55 y.o. female who presents today for her Complete Annual Exam. She feels well. She reports exercising 3-4 times a week. She reports she is sleeping fairly well. She recently had a mammogram.  She denies breast complaints.  She did not do colonoscopy last year - she would like to consider cologuard.  Hypertension  This is a chronic problem. The current episode started more than 1 month ago. The problem is unchanged. The problem is controlled. Pertinent negatives include no chest pain, headaches, palpitations or shortness of breath.  Gastroesophageal Reflux  She complains of heartburn. She reports no abdominal pain, no chest pain, no coughing or no wheezing. This is a recurrent problem. The problem occurs occasionally. The symptoms are aggravated by certain foods. Pertinent negatives include no fatigue.      Review of Systems  Constitutional: Negative for chills, fatigue and fever.  HENT: Negative for congestion, hearing loss, tinnitus, trouble swallowing and voice change.   Eyes: Negative for visual disturbance.  Respiratory: Negative for cough, chest tightness, shortness of breath and wheezing.   Cardiovascular: Negative for chest pain, palpitations and leg swelling.  Gastrointestinal: Positive for heartburn. Negative for abdominal pain, constipation, diarrhea and vomiting.  Endocrine: Negative for polydipsia and polyuria.  Genitourinary: Negative for dysuria, frequency, genital sores, vaginal bleeding and vaginal discharge.  Musculoskeletal: Negative for arthralgias, gait problem and joint swelling.  Skin: Negative for color change and rash.  Neurological: Negative for dizziness, tremors, light-headedness and headaches.  Hematological: Negative for adenopathy. Does not bruise/bleed easily.  Psychiatric/Behavioral: Positive for sleep disturbance. Negative  for dysphoric mood. The patient is not nervous/anxious.     Patient Active Problem List   Diagnosis Date Noted  . Hyperlipidemia, mild 04/08/2015  . Essential (primary) hypertension 03/04/2015  . Acid reflux 03/04/2015  . Local edema 03/04/2015  . Abnormal LFTs 03/04/2015    Prior to Admission medications   Medication Sig Start Date End Date Taking? Authorizing Provider  esomeprazole (NEXIUM 24HR) 20 MG capsule Take 1 capsule by mouth daily.   Yes Historical Provider, MD  hydrochlorothiazide (HYDRODIURIL) 25 MG tablet Take 1 tablet (25 mg total) by mouth daily. 11/27/15  Yes Reubin MilanLaura H Vanden Fawaz, MD    No Known Allergies  Past Surgical History:  Procedure Laterality Date  . ABDOMINAL HYSTERECTOMY     Right ovary remains  . TUBAL LIGATION      Social History  Substance Use Topics  . Smoking status: Former Games developermoker  . Smokeless tobacco: Never Used  . Alcohol use 1.2 oz/week    2 Standard drinks or equivalent per week     Medication list has been reviewed and updated.   Physical Exam  Constitutional: She is oriented to person, place, and time. She appears well-developed and well-nourished. No distress.  HENT:  Head: Normocephalic and atraumatic.  Right Ear: Tympanic membrane and ear canal normal.  Left Ear: Tympanic membrane and ear canal normal.  Nose: Right sinus exhibits no maxillary sinus tenderness. Left sinus exhibits no maxillary sinus tenderness.  Mouth/Throat: Uvula is midline and oropharynx is clear and moist.  Eyes: Conjunctivae and EOM are normal. Right eye exhibits no discharge. Left eye exhibits no discharge. No scleral icterus.  Neck: Normal range of motion. Carotid bruit is not present. No erythema present. No thyromegaly present.  Cardiovascular: Normal rate,  regular rhythm, normal heart sounds and normal pulses.   Pulmonary/Chest: Effort normal. No respiratory distress. She has no wheezes.  Abdominal: Soft. Bowel sounds are normal. There is no  hepatosplenomegaly. There is no tenderness. There is no CVA tenderness.  Musculoskeletal: Normal range of motion.  Lymphadenopathy:    She has no cervical adenopathy.    She has no axillary adenopathy.  Neurological: She is alert and oriented to person, place, and time. She has normal reflexes. No cranial nerve deficit or sensory deficit.  Skin: Skin is warm, dry and intact. No rash noted.  Psychiatric: She has a normal mood and affect. Her speech is normal and behavior is normal. Thought content normal.  Nursing note and vitals reviewed.   BP 122/82   Pulse 87   Resp 16   Ht 5\' 5"  (1.651 m)   Wt 186 lb 6.4 oz (84.6 kg)   SpO2 99%   BMI 31.02 kg/m   Assessment and Plan: 1. Annual physical exam Continue exercise and healthy diet Call back regarding Cologuard vs Colonoscopy - POCT urinalysis dipstick  2. Essential (primary) hypertension controlled - Comprehensive metabolic panel - TSH - hydrochlorothiazide (HYDRODIURIL) 25 MG tablet; Take 1 tablet (25 mg total) by mouth daily.  Dispense: 90 tablet; Refill: 1  3. Hyperlipidemia, mild Diet and exercise for now; will advise if medication is needed - Lipid panel  4. Gastroesophageal reflux disease without esophagitis Stable on PRN nexium otc - CBC with Differential/Platelet   Bari EdwardLaura Horace Wishon, MD [facility]  05/18/2016

## 2016-05-19 LAB — CBC WITH DIFFERENTIAL/PLATELET
BASOS ABS: 0 10*3/uL (ref 0.0–0.2)
Basos: 0 %
EOS (ABSOLUTE): 0.3 10*3/uL (ref 0.0–0.4)
Eos: 5 %
Hematocrit: 42 % (ref 34.0–46.6)
Hemoglobin: 14.4 g/dL (ref 11.1–15.9)
Immature Grans (Abs): 0 10*3/uL (ref 0.0–0.1)
Immature Granulocytes: 0 %
LYMPHS ABS: 2.2 10*3/uL (ref 0.7–3.1)
Lymphs: 38 %
MCH: 31 pg (ref 26.6–33.0)
MCHC: 34.3 g/dL (ref 31.5–35.7)
MCV: 91 fL (ref 79–97)
MONOS ABS: 0.4 10*3/uL (ref 0.1–0.9)
Monocytes: 7 %
NEUTROS ABS: 3 10*3/uL (ref 1.4–7.0)
Neutrophils: 50 %
Platelets: 263 10*3/uL (ref 150–379)
RBC: 4.64 x10E6/uL (ref 3.77–5.28)
RDW: 13 % (ref 12.3–15.4)
WBC: 6 10*3/uL (ref 3.4–10.8)

## 2016-05-19 LAB — COMPREHENSIVE METABOLIC PANEL
ALBUMIN: 4.6 g/dL (ref 3.5–5.5)
ALK PHOS: 66 IU/L (ref 39–117)
ALT: 70 IU/L — ABNORMAL HIGH (ref 0–32)
AST: 76 IU/L — AB (ref 0–40)
Albumin/Globulin Ratio: 1.4 (ref 1.2–2.2)
BILIRUBIN TOTAL: 0.7 mg/dL (ref 0.0–1.2)
BUN / CREAT RATIO: 19 (ref 9–23)
BUN: 11 mg/dL (ref 6–24)
CHLORIDE: 99 mmol/L (ref 96–106)
CO2: 24 mmol/L (ref 18–29)
CREATININE: 0.59 mg/dL (ref 0.57–1.00)
Calcium: 9.8 mg/dL (ref 8.7–10.2)
GFR calc Af Amer: 118 mL/min/{1.73_m2} (ref >59)
GFR calc non Af Amer: 103 mL/min/{1.73_m2} (ref >59)
GLUCOSE: 108 mg/dL — AB (ref 65–99)
Globulin, Total: 3.3 g/dL (ref 1.5–4.5)
Potassium: 5 mmol/L (ref 3.5–5.2)
SODIUM: 141 mmol/L (ref 134–144)
Total Protein: 7.9 g/dL (ref 6.0–8.5)

## 2016-05-19 LAB — LIPID PANEL
CHOLESTEROL TOTAL: 248 mg/dL — AB (ref 100–199)
Chol/HDL Ratio: 2.2 ratio units (ref 0.0–4.4)
HDL: 113 mg/dL (ref >39)
LDL CALC: 112 mg/dL — AB (ref 0–99)
TRIGLYCERIDES: 113 mg/dL (ref 0–149)
VLDL CHOLESTEROL CAL: 23 mg/dL (ref 5–40)

## 2016-05-19 LAB — TSH: TSH: 2.45 u[IU]/mL (ref 0.450–4.500)

## 2016-11-28 ENCOUNTER — Telehealth: Payer: Self-pay | Admitting: *Deleted

## 2016-11-28 DIAGNOSIS — I10 Essential (primary) hypertension: Secondary | ICD-10-CM

## 2016-11-28 MED ORDER — HYDROCHLOROTHIAZIDE 25 MG PO TABS: 25.0000 mg | ORAL_TABLET | Freq: Every day | ORAL | 1 refills | Status: DC

## 2016-11-28 NOTE — Telephone Encounter (Signed)
Pt called requesting refill of HCTZ. Stated she had called pharmacy but I didn't see where they had requested this so ran it by Dr. Judithann GravesBerglund and she okayed the refill

## 2016-12-26 ENCOUNTER — Encounter: Payer: Self-pay | Admitting: Internal Medicine

## 2016-12-26 ENCOUNTER — Other Ambulatory Visit: Payer: Self-pay | Admitting: Internal Medicine

## 2016-12-26 DIAGNOSIS — Z1211 Encounter for screening for malignant neoplasm of colon: Secondary | ICD-10-CM

## 2016-12-26 NOTE — Telephone Encounter (Signed)
Patient would like cologaurd ordered- please advise.

## 2017-01-03 LAB — COLOGUARD: Cologuard: NEGATIVE

## 2017-01-07 LAB — COLOGUARD

## 2017-02-10 ENCOUNTER — Encounter: Payer: Self-pay | Admitting: Internal Medicine

## 2017-05-19 ENCOUNTER — Encounter: Payer: Self-pay | Admitting: Internal Medicine

## 2017-05-19 ENCOUNTER — Ambulatory Visit (INDEPENDENT_AMBULATORY_CARE_PROVIDER_SITE_OTHER): Payer: 59 | Admitting: Internal Medicine

## 2017-05-19 VITALS — BP 122/74 | HR 98 | Ht 65.0 in | Wt 183.0 lb

## 2017-05-19 DIAGNOSIS — R600 Localized edema: Secondary | ICD-10-CM

## 2017-05-19 DIAGNOSIS — E785 Hyperlipidemia, unspecified: Secondary | ICD-10-CM

## 2017-05-19 DIAGNOSIS — Z0000 Encounter for general adult medical examination without abnormal findings: Secondary | ICD-10-CM

## 2017-05-19 DIAGNOSIS — I10 Essential (primary) hypertension: Secondary | ICD-10-CM

## 2017-05-19 LAB — POCT URINALYSIS DIPSTICK
Bilirubin, UA: NEGATIVE
Blood, UA: NEGATIVE
Glucose, UA: NEGATIVE
Ketones, UA: NEGATIVE
LEUKOCYTES UA: NEGATIVE
Nitrite, UA: NEGATIVE
PH UA: 6 (ref 5.0–8.0)
PROTEIN UA: NEGATIVE
Spec Grav, UA: 1.02 (ref 1.010–1.025)
UROBILINOGEN UA: 0.2 U/dL

## 2017-05-19 NOTE — Progress Notes (Signed)
Date:  05/19/2017   Name:  Sherri Cox   DOB:  Feb 16, 1961   MRN:  [id number]   Chief Complaint: Annual Exam (Mammogram was done on 1st- no breast exam today. ) Sherri Cox is a 56 y.o. female who presents today for her Complete Annual Exam. She feels well. She reports exercising at the gym and walking. She reports she is sleeping well.   Hypertension  This is a chronic problem. The problem is controlled. Pertinent negatives include no chest pain, headaches, palpitations or shortness of breath. There are no associated agents to hypertension. Past treatments include diuretics. The current treatment provides significant improvement.  Hyperlipidemia  This is a chronic problem. Recent lipid tests were reviewed and are variable. Pertinent negatives include no chest pain or shortness of breath. She is currently on no antihyperlipidemic treatment.  Gastroesophageal Reflux  She complains of heartburn. She reports no abdominal pain, no chest pain, no coughing or no wheezing. This is a recurrent problem. The problem occurs rarely. The problem has been unchanged. Pertinent negatives include no fatigue. She has tried a PPI (otc nexium as needed) for the symptoms.     Review of Systems  Constitutional: Negative for chills, fatigue and fever.  HENT: Negative for congestion, hearing loss, tinnitus, trouble swallowing and voice change.   Eyes: Negative for visual disturbance.  Respiratory: Negative for cough, chest tightness, shortness of breath and wheezing.   Cardiovascular: Negative for chest pain, palpitations and leg swelling.  Gastrointestinal: Positive for heartburn. Negative for abdominal pain, constipation, diarrhea and vomiting.  Endocrine: Negative for polydipsia and polyuria.  Genitourinary: Negative for dysuria, frequency, genital sores, vaginal bleeding and vaginal discharge.  Musculoskeletal: Negative for arthralgias, gait problem and joint swelling.  Skin: Negative for color change  and rash.  Neurological: Negative for dizziness, tremors, light-headedness and headaches.  Hematological: Negative for adenopathy. Does not bruise/bleed easily.  Psychiatric/Behavioral: Negative for dysphoric mood and sleep disturbance. The patient is not nervous/anxious.     Patient Active Problem List   Diagnosis Date Noted  . Hyperlipidemia, mild 04/08/2015  . Essential (primary) hypertension 03/04/2015  . Acid reflux 03/04/2015  . Local edema 03/04/2015  . Abnormal LFTs 03/04/2015    Prior to Admission medications   Medication Sig Start Date End Date Taking? Authorizing Provider  esomeprazole (NEXIUM 24HR) 20 MG capsule Take 1 capsule by mouth daily.   Yes [provider]  hydrochlorothiazide (HYDRODIURIL) 25 MG tablet Take 1 tablet (25 mg total) by mouth daily. 11/28/16  Yes Reubin MilanBerglund, Brandan Robicheaux H, MD    No Known Allergies  Past Surgical History:  Procedure Laterality Date  . ABDOMINAL HYSTERECTOMY     Right ovary remains  . TUBAL LIGATION      Social History   Tobacco Use  . Smoking status: Former Games developermoker  . Smokeless tobacco: Never Used  Substance Use Topics  . Alcohol use: Yes    Alcohol/week: 1.2 oz    Types: 2 Standard drinks or equivalent per week  . Drug use: Not on file     Medication list has been reviewed and updated.  PHQ 2/9 Scores 05/19/2017 05/18/2016  PHQ - 2 Score 0 0    Physical Exam  Constitutional: She is oriented to person, place, and time. She appears well-developed and well-nourished. No distress.  HENT:  Head: Normocephalic and atraumatic.  Right Ear: Tympanic membrane and ear canal normal.  Left Ear: Tympanic membrane and ear canal normal.  Nose: Right sinus exhibits no  maxillary sinus tenderness. Left sinus exhibits no maxillary sinus tenderness.  Mouth/Throat: Uvula is midline and oropharynx is clear and moist.  Eyes: Conjunctivae and EOM are normal. Right eye exhibits no discharge. Left eye exhibits no discharge. No scleral  icterus.  Neck: Normal range of motion. Carotid bruit is not present. No erythema present. No thyromegaly present.  Cardiovascular: Normal rate, regular rhythm, normal heart sounds and normal pulses.  Pulmonary/Chest: Effort normal. No respiratory distress. She has no wheezes.  Abdominal: Soft. Bowel sounds are normal. There is no hepatosplenomegaly. There is no tenderness. There is no CVA tenderness.  Musculoskeletal: Normal range of motion.  Lymphadenopathy:    She has no cervical adenopathy.    She has no axillary adenopathy.  Neurological: She is alert and oriented to person, place, and time. She has normal reflexes. No cranial nerve deficit or sensory deficit.  Skin: Skin is warm, dry and intact. No rash noted.  Psychiatric: She has a normal mood and affect. Her speech is normal and behavior is normal. Thought content normal.  Nursing note and vitals reviewed.   BP 122/74   Pulse 98   Ht 5\' 5"  (1.651 m)   Wt 183 lb (83 kg)   SpO2 93% Comment: nails painted  BMI 30.45 kg/m   Assessment and Plan: 1. Annual physical exam Normal exam Encourage regular exercise - POCT urinalysis dipstick  2. Essential (primary) hypertension controlled - CBC with Differential/Platelet - Comprehensive metabolic panel  3. Local edema stable - TSH  4. Hyperlipidemia, mild Advise on medication if needed - Lipid panel   No orders of the defined types were placed in this encounter.   Partially dictated using Animal nutritionistDragon software. Any errors are unintentional.  Bari EdwardLaura Darice Vicario, MD [facility]  05/19/2017

## 2017-05-19 NOTE — Patient Instructions (Signed)
DASH Eating Plan DASH stands for "Dietary Approaches to Stop Hypertension." The DASH eating plan is a healthy eating plan that has been shown to reduce high blood pressure (hypertension). It may also reduce your risk for type 2 diabetes, heart disease, and stroke. The DASH eating plan may also help with weight loss. What are tips for following this plan? General guidelines  Avoid eating more than 2,300 mg (milligrams) of salt (sodium) a day. If you have hypertension, you may need to reduce your sodium intake to 1,500 mg a day.  Limit alcohol intake to no more than 1 drink a day for nonpregnant women and 2 drinks a day for men. One drink equals 12 oz of beer, 5 oz of wine, or 1 oz of hard liquor.  Work with your health care provider to maintain a healthy body weight or to lose weight. Ask what an ideal weight is for you.  Get at least 30 minutes of exercise that causes your heart to beat faster (aerobic exercise) most days of the week. Activities may include walking, swimming, or biking.  Work with your health care provider or diet and nutrition specialist (dietitian) to adjust your eating plan to your individual calorie needs. Reading food labels  Check food labels for the amount of sodium per serving. Choose foods with less than 5 percent of the Daily Value of sodium. Generally, foods with less than 300 mg of sodium per serving fit into this eating plan.  To find whole grains, look for the word "whole" as the first word in the ingredient list. Shopping  Buy products labeled as "low-sodium" or "no salt added."  Buy fresh foods. Avoid canned foods and premade or frozen meals. Cooking  Avoid adding salt when cooking. Use salt-free seasonings or herbs instead of table salt or sea salt. Check with your health care provider or pharmacist before using salt substitutes.  Do not fry foods. Cook foods using healthy methods such as baking, boiling, grilling, and broiling instead.  Cook with  heart-healthy oils, such as olive, canola, soybean, or sunflower oil. Meal planning   Eat a balanced diet that includes: ? 5 or more servings of fruits and vegetables each day. At each meal, try to fill half of your plate with fruits and vegetables. ? Up to 6-8 servings of whole grains each day. ? Less than 6 oz of lean meat, poultry, or fish each day. A 3-oz serving of meat is about the same size as a deck of cards. One egg equals 1 oz. ? 2 servings of low-fat dairy each day. ? A serving of nuts, seeds, or beans 5 times each week. ? Heart-healthy fats. Healthy fats called Omega-3 fatty acids are found in foods such as flaxseeds and coldwater fish, like sardines, salmon, and mackerel.  Limit how much you eat of the following: ? Canned or prepackaged foods. ? Food that is high in trans fat, such as fried foods. ? Food that is high in saturated fat, such as fatty meat. ? Sweets, desserts, sugary drinks, and other foods with added sugar. ? Full-fat dairy products.  Do not salt foods before eating.  Try to eat at least 2 vegetarian meals each week.  Eat more home-cooked food and less restaurant, buffet, and fast food.  When eating at a restaurant, ask that your food be prepared with less salt or no salt, if possible. What foods are recommended? The items listed may not be a complete list. Talk with your dietitian about what   dietary choices are best for you. Grains Whole-grain or whole-wheat bread. Whole-grain or whole-wheat pasta. Brown rice. Oatmeal. Quinoa. Bulgur. Whole-grain and low-sodium cereals. Pita bread. Low-fat, low-sodium crackers. Whole-wheat flour tortillas. Vegetables Fresh or frozen vegetables (raw, steamed, roasted, or grilled). Low-sodium or reduced-sodium tomato and vegetable juice. Low-sodium or reduced-sodium tomato sauce and tomato paste. Low-sodium or reduced-sodium canned vegetables. Fruits All fresh, dried, or frozen fruit. Canned fruit in natural juice (without  added sugar). Meat and other protein foods Skinless chicken or turkey. Ground chicken or turkey. Pork with fat trimmed off. Fish and seafood. Egg whites. Dried beans, peas, or lentils. Unsalted nuts, nut butters, and seeds. Unsalted canned beans. Lean cuts of beef with fat trimmed off. Low-sodium, lean deli meat. Dairy Low-fat (1%) or fat-free (skim) milk. Fat-free, low-fat, or reduced-fat cheeses. Nonfat, low-sodium ricotta or cottage cheese. Low-fat or nonfat yogurt. Low-fat, low-sodium cheese. Fats and oils Soft margarine without trans fats. Vegetable oil. Low-fat, reduced-fat, or light mayonnaise and salad dressings (reduced-sodium). Canola, safflower, olive, soybean, and sunflower oils. Avocado. Seasoning and other foods Herbs. Spices. Seasoning mixes without salt. Unsalted popcorn and pretzels. Fat-free sweets. What foods are not recommended? The items listed may not be a complete list. Talk with your dietitian about what dietary choices are best for you. Grains Baked goods made with fat, such as croissants, muffins, or some breads. Dry pasta or rice meal packs. Vegetables Creamed or fried vegetables. Vegetables in a cheese sauce. Regular canned vegetables (not low-sodium or reduced-sodium). Regular canned tomato sauce and paste (not low-sodium or reduced-sodium). Regular tomato and vegetable juice (not low-sodium or reduced-sodium). Pickles. Olives. Fruits Canned fruit in a light or heavy syrup. Fried fruit. Fruit in cream or butter sauce. Meat and other protein foods Fatty cuts of meat. Ribs. Fried meat. Bacon. Sausage. Bologna and other processed lunch meats. Salami. Fatback. Hotdogs. Bratwurst. Salted nuts and seeds. Canned beans with added salt. Canned or smoked fish. Whole eggs or egg yolks. Chicken or turkey with skin. Dairy Whole or 2% milk, cream, and half-and-half. Whole or full-fat cream cheese. Whole-fat or sweetened yogurt. Full-fat cheese. Nondairy creamers. Whipped toppings.  Processed cheese and cheese spreads. Fats and oils Butter. Stick margarine. Lard. Shortening. Ghee. Bacon fat. Tropical oils, such as coconut, palm kernel, or palm oil. Seasoning and other foods Salted popcorn and pretzels. Onion salt, garlic salt, seasoned salt, table salt, and sea salt. Worcestershire sauce. Tartar sauce. Barbecue sauce. Teriyaki sauce. Soy sauce, including reduced-sodium. Steak sauce. Canned and packaged gravies. Fish sauce. Oyster sauce. Cocktail sauce. Horseradish that you find on the shelf. Ketchup. Mustard. Meat flavorings and tenderizers. Bouillon cubes. Hot sauce and Tabasco sauce. Premade or packaged marinades. Premade or packaged taco seasonings. Relishes. Regular salad dressings. Where to find more information:  National Heart, Lung, and Blood Institute: www.nhlbi.nih.gov  American Heart Association: www.heart.org Summary  The DASH eating plan is a healthy eating plan that has been shown to reduce high blood pressure (hypertension). It may also reduce your risk for type 2 diabetes, heart disease, and stroke.  With the DASH eating plan, you should limit salt (sodium) intake to 2,300 mg a day. If you have hypertension, you may need to reduce your sodium intake to 1,500 mg a day.  When on the DASH eating plan, aim to eat more fresh fruits and vegetables, whole grains, lean proteins, low-fat dairy, and heart-healthy fats.  Work with your health care provider or diet and nutrition specialist (dietitian) to adjust your eating plan to your individual   calorie needs. This information is not intended to replace advice given to you by your health care provider. Make sure you discuss any questions you have with your health care provider. Document Released: 05/26/2011 Document Revised: 05/30/2016 Document Reviewed: 05/30/2016 Elsevier Interactive Patient Education  2017 Elsevier Inc.  

## 2017-05-20 LAB — COMPREHENSIVE METABOLIC PANEL
ALT: 78 IU/L — AB (ref 0–32)
AST: 62 IU/L — ABNORMAL HIGH (ref 0–40)
Albumin/Globulin Ratio: 1.6 (ref 1.2–2.2)
Albumin: 4.6 g/dL (ref 3.5–5.5)
Alkaline Phosphatase: 59 IU/L (ref 39–117)
BILIRUBIN TOTAL: 0.7 mg/dL (ref 0.0–1.2)
BUN/Creatinine Ratio: 19 (ref 9–23)
BUN: 13 mg/dL (ref 6–24)
CALCIUM: 9.4 mg/dL (ref 8.7–10.2)
CHLORIDE: 100 mmol/L (ref 96–106)
CO2: 24 mmol/L (ref 20–29)
Creatinine, Ser: 0.68 mg/dL (ref 0.57–1.00)
GFR, EST AFRICAN AMERICAN: 112 mL/min/{1.73_m2} (ref >59)
GFR, EST NON AFRICAN AMERICAN: 97 mL/min/{1.73_m2} (ref >59)
GLUCOSE: 84 mg/dL (ref 65–99)
Globulin, Total: 2.8 g/dL (ref 1.5–4.5)
Potassium: 4.7 mmol/L (ref 3.5–5.2)
Sodium: 139 mmol/L (ref 134–144)
TOTAL PROTEIN: 7.4 g/dL (ref 6.0–8.5)

## 2017-05-20 LAB — CBC WITH DIFFERENTIAL/PLATELET
BASOS ABS: 0 10*3/uL (ref 0.0–0.2)
BASOS: 0 %
EOS (ABSOLUTE): 0.2 10*3/uL (ref 0.0–0.4)
Eos: 4 %
HEMOGLOBIN: 14.1 g/dL (ref 11.1–15.9)
Hematocrit: 40.9 % (ref 34.0–46.6)
IMMATURE GRANS (ABS): 0 10*3/uL (ref 0.0–0.1)
Immature Granulocytes: 0 %
LYMPHS ABS: 2.4 10*3/uL (ref 0.7–3.1)
LYMPHS: 38 %
MCH: 31.2 pg (ref 26.6–33.0)
MCHC: 34.5 g/dL (ref 31.5–35.7)
MCV: 91 fL (ref 79–97)
MONOCYTES: 7 %
Monocytes Absolute: 0.4 10*3/uL (ref 0.1–0.9)
NEUTROS ABS: 3.2 10*3/uL (ref 1.4–7.0)
Neutrophils: 51 %
PLATELETS: 251 10*3/uL (ref 150–379)
RBC: 4.52 x10E6/uL (ref 3.77–5.28)
RDW: 13.4 % (ref 12.3–15.4)
WBC: 6.2 10*3/uL (ref 3.4–10.8)

## 2017-05-20 LAB — LIPID PANEL
Chol/HDL Ratio: 2.1 ratio (ref 0.0–4.4)
Cholesterol, Total: 226 mg/dL — ABNORMAL HIGH (ref 100–199)
HDL: 107 mg/dL (ref >39)
LDL Calculated: 101 mg/dL — ABNORMAL HIGH (ref 0–99)
Triglycerides: 90 mg/dL (ref 0–149)
VLDL CHOLESTEROL CAL: 18 mg/dL (ref 5–40)

## 2017-05-20 LAB — TSH: TSH: 1.57 u[IU]/mL (ref 0.450–4.500)

## 2017-05-24 NOTE — Progress Notes (Signed)
LVMTCB

## 2017-06-09 ENCOUNTER — Encounter: Payer: Self-pay | Admitting: Internal Medicine

## 2017-06-21 ENCOUNTER — Other Ambulatory Visit: Payer: Self-pay | Admitting: Internal Medicine

## 2017-06-21 ENCOUNTER — Other Ambulatory Visit: Payer: Self-pay

## 2017-06-21 DIAGNOSIS — I10 Essential (primary) hypertension: Secondary | ICD-10-CM

## 2017-06-21 MED ORDER — HYDROCHLOROTHIAZIDE 25 MG PO TABS: 25.0000 mg | ORAL_TABLET | Freq: Every day | ORAL | 1 refills | Status: DC

## 2018-01-29 ENCOUNTER — Other Ambulatory Visit: Payer: Self-pay | Admitting: Internal Medicine

## 2018-01-29 DIAGNOSIS — I10 Essential (primary) hypertension: Secondary | ICD-10-CM

## 2018-05-05 ENCOUNTER — Other Ambulatory Visit: Payer: Self-pay | Admitting: Internal Medicine

## 2018-05-05 DIAGNOSIS — I10 Essential (primary) hypertension: Secondary | ICD-10-CM

## 2018-05-23 ENCOUNTER — Encounter: Payer: Self-pay | Admitting: Internal Medicine

## 2018-05-23 ENCOUNTER — Ambulatory Visit (INDEPENDENT_AMBULATORY_CARE_PROVIDER_SITE_OTHER): Payer: 59 | Admitting: Internal Medicine

## 2018-05-23 VITALS — BP 148/92 | HR 100 | Ht 65.0 in | Wt 176.0 lb

## 2018-05-23 DIAGNOSIS — I10 Essential (primary) hypertension: Secondary | ICD-10-CM

## 2018-05-23 DIAGNOSIS — Z1231 Encounter for screening mammogram for malignant neoplasm of breast: Secondary | ICD-10-CM | POA: Diagnosis not present

## 2018-05-23 DIAGNOSIS — E785 Hyperlipidemia, unspecified: Secondary | ICD-10-CM | POA: Diagnosis not present

## 2018-05-23 DIAGNOSIS — K219 Gastro-esophageal reflux disease without esophagitis: Secondary | ICD-10-CM | POA: Insufficient documentation

## 2018-05-23 DIAGNOSIS — Z0000 Encounter for general adult medical examination without abnormal findings: Secondary | ICD-10-CM

## 2018-05-23 LAB — POCT URINALYSIS DIPSTICK
BILIRUBIN UA: NEGATIVE
GLUCOSE UA: NEGATIVE
Ketones, UA: NEGATIVE
Nitrite, UA: NEGATIVE
PH UA: 7 (ref 5.0–8.0)
Protein, UA: NEGATIVE
RBC UA: NEGATIVE
Spec Grav, UA: <=1.005 — AB (ref 1.010–1.025)
Urobilinogen, UA: 0.2 E.U./dL

## 2018-05-23 MED ORDER — LOSARTAN POTASSIUM 50 MG PO TABS: 50.0000 mg | ORAL_TABLET | Freq: Every day | ORAL | 0 refills | Status: DC

## 2018-05-23 NOTE — Patient Instructions (Addendum)
Would like to see BP come down 10-15 points on the top number.  Check on coverage of shingles vaccine (Shingrix - 2 vaccine series)

## 2018-05-23 NOTE — Progress Notes (Signed)
Date:  05/23/2018   Name:  Sherri Cox   DOB:  [birth year]   MRN:  [id number]   Chief Complaint: Annual Exam (Just had MAW 04/23/2018 so refused breast exam. No pap- hysterectomy. ) Sherri Cox is a 57 y.o. female who presents today for her Complete Annual Exam. She feels well. She reports exercising regularly at the gym. She reports she is sleeping well. Mammogram was done last month.  Colonoscopy was done last year.  She no longer needs Paps due to hysterectomy.    Hypertension  This is a chronic problem. Condition status: she does not monitor. Pertinent negatives include no chest pain, headaches, palpitations or shortness of breath. Past treatments include diuretics. Improvement on treatment: previously BP goes up in office but then on repeat is normal. There are no compliance problems.   Gastroesophageal Reflux  She complains of heartburn. She reports no abdominal pain, no chest pain, no coughing or no wheezing. This is a recurrent problem. The problem occurs rarely. Pertinent negatives include no fatigue. She has tried a PPI for the symptoms.    Review of Systems  Constitutional: Negative for chills, fatigue and fever.  HENT: Negative for congestion, hearing loss, tinnitus, trouble swallowing and voice change.   Eyes: Negative for visual disturbance.  Respiratory: Negative for cough, chest tightness, shortness of breath and wheezing.   Cardiovascular: Negative for chest pain, palpitations and leg swelling.  Gastrointestinal: Positive for heartburn. Negative for abdominal pain, constipation, diarrhea and vomiting.  Endocrine: Negative for polydipsia and polyuria.  Genitourinary: Negative for dysuria, frequency, genital sores, vaginal bleeding and vaginal discharge.  Musculoskeletal: Negative for arthralgias, gait problem and joint swelling.  Skin: Negative for color change and rash.  Neurological: Negative for dizziness, tremors, light-headedness and headaches.  Hematological:  Negative for adenopathy. Does not bruise/bleed easily.  Psychiatric/Behavioral: Negative for dysphoric mood and sleep disturbance. The patient is not nervous/anxious.     Patient Active Problem List   Diagnosis Date Noted  . Gastroesophageal reflux disease without esophagitis 05/23/2018  . Hyperlipidemia, mild 04/08/2015  . Essential (primary) hypertension 03/04/2015  . Local edema 03/04/2015  . Abnormal LFTs 03/04/2015    No Known Allergies  Past Surgical History:  Procedure Laterality Date  . ABDOMINAL HYSTERECTOMY     Right ovary remains  . TUBAL LIGATION      Social History   Tobacco Use  . Smoking status: Former Smoker    Packs/day: 1.00    Years: 23.00    Pack years: 23.00    Types: Cigarettes    Start date: 11/21/1977    Last attempt to quit: 11/21/2000    Years since quitting: 17.5  . Smokeless tobacco: Never Used  Substance Use Topics  . Alcohol use: Yes    Alcohol/week: 2.0 standard drinks    Types: 2 Standard drinks or equivalent per week  . Drug use: Not on file     Medication list has been reviewed and updated.  Current Meds  Medication Sig  . esomeprazole (NEXIUM 24HR) 20 MG capsule Take 1 capsule by mouth as needed.   . hydrochlorothiazide (HYDRODIURIL) 25 MG tablet TAKE 1 TABLET BY MOUTH DAILY    PHQ 2/9 Scores 05/23/2018 05/19/2017 05/18/2016  PHQ - 2 Score 0 0 0    Physical Exam  Constitutional: She is oriented to person, place, and time. She appears well-developed and well-nourished. No distress.  HENT:  Head: Normocephalic and atraumatic.  Right Ear: Tympanic membrane and ear canal normal.  Left Ear: Tympanic membrane and ear canal normal.  Nose: Right sinus exhibits no maxillary sinus tenderness. Left sinus exhibits no maxillary sinus tenderness.  Mouth/Throat: Uvula is midline and oropharynx is clear and moist.  Eyes: Conjunctivae and EOM are normal. Right eye exhibits no discharge. Left eye exhibits no discharge. No scleral icterus.    Neck: Normal range of motion. Carotid bruit is not present. No erythema present. No thyromegaly present.  Cardiovascular: Normal rate, regular rhythm, normal heart sounds and normal pulses.  Pulmonary/Chest: Effort normal. No respiratory distress. She has no wheezes.  Pt declined breast exam  Abdominal: Soft. Bowel sounds are normal. There is no hepatosplenomegaly. There is no tenderness. There is no CVA tenderness.  Musculoskeletal: Normal range of motion.  Lymphadenopathy:    She has no cervical adenopathy.    She has no axillary adenopathy.  Neurological: She is alert and oriented to person, place, and time. She has normal reflexes. No cranial nerve deficit or sensory deficit.  Skin: Skin is warm, dry and intact. No rash noted.  Psychiatric: She has a normal mood and affect. Her speech is normal and behavior is normal. Thought content normal.  Nursing note and vitals reviewed.   BP (!) 148/92 (BP Location: Right Arm, Patient Position: Sitting, Cuff Size: Normal)   Pulse 100   Ht 5\' 5"  (1.651 m)   Wt 176 lb (79.8 kg)   SpO2 95%   BMI 29.29 kg/m   Assessment and Plan: 1. Annual physical exam Continue exercise, healthy diet - POCT urinalysis dipstick  2. Encounter for screening mammogram for breast cancer Recently completed  3. Essential (primary) hypertension Need to add medication Pt to monitor at home - Comprehensive metabolic panel - TSH - losartan (COZAAR) 50 MG tablet; Take 1 tablet (50 mg total) by mouth daily.  Dispense: 90 tablet; Refill: 0  4. Gastroesophageal reflux disease without esophagitis Continue PPI PRN - CBC with Differential/Platelet  5. Hyperlipidemia, mild Check labs - Lipid panel   Partially dictated using Animal nutritionist. Any errors are unintentional.  Bari Edward, MD [facility]  05/23/2018

## 2018-05-24 LAB — CBC WITH DIFFERENTIAL/PLATELET
BASOS: 1 %
Basophils Absolute: 0.1 10*3/uL (ref 0.0–0.2)
EOS (ABSOLUTE): 0.2 10*3/uL (ref 0.0–0.4)
Eos: 3 %
Hematocrit: 44.7 % (ref 34.0–46.6)
Hemoglobin: 15.5 g/dL (ref 11.1–15.9)
IMMATURE GRANS (ABS): 0 10*3/uL (ref 0.0–0.1)
Immature Granulocytes: 0 %
LYMPHS ABS: 2.4 10*3/uL (ref 0.7–3.1)
Lymphs: 33 %
MCH: 31.9 pg (ref 26.6–33.0)
MCHC: 34.7 g/dL (ref 31.5–35.7)
MCV: 92 fL (ref 79–97)
MONOS ABS: 0.5 10*3/uL (ref 0.1–0.9)
Monocytes: 6 %
NEUTROS ABS: 4 10*3/uL (ref 1.4–7.0)
Neutrophils: 57 %
PLATELETS: 294 10*3/uL (ref 150–450)
RBC: 4.86 x10E6/uL (ref 3.77–5.28)
RDW: 11.9 % — AB (ref 12.3–15.4)
WBC: 7.2 10*3/uL (ref 3.4–10.8)

## 2018-05-24 LAB — COMPREHENSIVE METABOLIC PANEL
A/G RATIO: 1.7 (ref 1.2–2.2)
ALBUMIN: 5.2 g/dL (ref 3.5–5.5)
ALT: 54 IU/L — ABNORMAL HIGH (ref 0–32)
AST: 56 IU/L — AB (ref 0–40)
Alkaline Phosphatase: 71 IU/L (ref 39–117)
BUN / CREAT RATIO: 18 (ref 9–23)
BUN: 13 mg/dL (ref 6–24)
Bilirubin Total: 0.9 mg/dL (ref 0.0–1.2)
CO2: 25 mmol/L (ref 20–29)
CREATININE: 0.71 mg/dL (ref 0.57–1.00)
Calcium: 10.2 mg/dL (ref 8.7–10.2)
Chloride: 94 mmol/L — ABNORMAL LOW (ref 96–106)
GFR calc Af Amer: 109 mL/min/{1.73_m2} (ref >59)
GFR, EST NON AFRICAN AMERICAN: 94 mL/min/{1.73_m2} (ref >59)
GLOBULIN, TOTAL: 3.1 g/dL (ref 1.5–4.5)
Glucose: 119 mg/dL — ABNORMAL HIGH (ref 65–99)
POTASSIUM: 3.8 mmol/L (ref 3.5–5.2)
SODIUM: 139 mmol/L (ref 134–144)
Total Protein: 8.3 g/dL (ref 6.0–8.5)

## 2018-05-24 LAB — LIPID PANEL
CHOL/HDL RATIO: 2.3 ratio (ref 0.0–4.4)
Cholesterol, Total: 278 mg/dL — ABNORMAL HIGH (ref 100–199)
HDL: 121 mg/dL (ref >39)
LDL CALC: 132 mg/dL — AB (ref 0–99)
TRIGLYCERIDES: 125 mg/dL (ref 0–149)
VLDL Cholesterol Cal: 25 mg/dL (ref 5–40)

## 2018-05-24 LAB — TSH: TSH: 2.32 u[IU]/mL (ref 0.450–4.500)

## 2018-05-29 LAB — HGB A1C W/O EAG: Hgb A1c MFr Bld: 5.4 % (ref 4.8–5.6)

## 2018-05-29 LAB — SPECIMEN STATUS REPORT

## 2018-06-08 ENCOUNTER — Other Ambulatory Visit: Payer: Self-pay | Admitting: Internal Medicine

## 2018-06-08 ENCOUNTER — Encounter: Payer: Self-pay | Admitting: Internal Medicine

## 2018-06-08 DIAGNOSIS — N3000 Acute cystitis without hematuria: Secondary | ICD-10-CM

## 2018-06-08 MED ORDER — CIPROFLOXACIN HCL 250 MG PO TABS: 250.0000 mg | ORAL_TABLET | Freq: Two times a day (BID) | ORAL | 0 refills | Status: DC

## 2018-06-08 NOTE — Telephone Encounter (Signed)
Please advise patient my chart message. Requesting something to be sent in for UTI. Trace Leuks on 12/4

## 2018-07-04 ENCOUNTER — Ambulatory Visit (INDEPENDENT_AMBULATORY_CARE_PROVIDER_SITE_OTHER): Payer: 59 | Admitting: Internal Medicine

## 2018-07-04 ENCOUNTER — Encounter: Payer: Self-pay | Admitting: Internal Medicine

## 2018-07-04 VITALS — BP 136/84 | HR 95 | Ht 65.0 in | Wt 176.6 lb

## 2018-07-04 DIAGNOSIS — R300 Dysuria: Secondary | ICD-10-CM | POA: Diagnosis not present

## 2018-07-04 DIAGNOSIS — I10 Essential (primary) hypertension: Secondary | ICD-10-CM

## 2018-07-04 LAB — POC URINALYSIS WITH MICROSCOPIC (NON AUTO)MANUAL RESULT
Bilirubin, UA: NEGATIVE
Blood, UA: NEGATIVE
Crystals: 0
Epithelial cells, urine per micros: 0
Glucose, UA: NEGATIVE
Ketones, UA: NEGATIVE
Mucus, UA: 0
Nitrite, UA: POSITIVE
Protein, UA: NEGATIVE
RBC: 0 M/uL — AB (ref 4.04–5.48)
Spec Grav, UA: 1.01
Urobilinogen, UA: 0.2 U/dL
WBC Casts, UA: 4
pH, UA: 6.5

## 2018-07-04 MED ORDER — LOSARTAN POTASSIUM 50 MG PO TABS: 50.0000 mg | ORAL_TABLET | Freq: Every day | ORAL | 3 refills | Status: DC

## 2018-07-04 MED ORDER — SULFAMETHOXAZOLE-TRIMETHOPRIM 800-160 MG PO TABS: 1.0000 | ORAL_TABLET | Freq: Two times a day (BID) | ORAL | 0 refills | Status: AC

## 2018-07-04 NOTE — Progress Notes (Signed)
Date:  07/04/2018   Name:  Sherri Cox   DOB:  01/10/1961   MRN:  [id number]   Chief Complaint: Hypertension (Bought a new BP machine for home and its been running good. ) and Urinary Tract Infection (Low back pain and urgency. )  Hypertension  This is a chronic problem. The problem has been gradually improving since onset. The problem is controlled. Pertinent negatives include no headaches or shortness of breath. Past treatments include angiotensin blockers and diuretics (losartan added last visit). The current treatment provides significant improvement.  Urinary Tract Infection   This is a recurrent problem. The current episode started yesterday. The problem occurs every urination. The quality of the pain is described as burning. The patient is experiencing no pain. Associated symptoms include flank pain. Pertinent negatives include no chills, hematuria or urgency.   Lab Results  Component Value Date   CREATININE 0.71 05/23/2018   BUN 13 05/23/2018   NA 139 05/23/2018   K 3.8 05/23/2018   CL 94 (L) 05/23/2018   CO2 25 05/23/2018     Review of Systems  Constitutional: Negative for chills, fatigue and fever.  Respiratory: Negative for cough, chest tightness, shortness of breath and wheezing.   Gastrointestinal: Negative for abdominal pain, blood in stool, constipation and diarrhea.  Genitourinary: Positive for dysuria and flank pain. Negative for hematuria and urgency.  Neurological: Negative for dizziness, light-headedness and headaches.  Psychiatric/Behavioral: Negative for dysphoric mood. The patient is not nervous/anxious.     Patient Active Problem List   Diagnosis Date Noted  . Gastroesophageal reflux disease without esophagitis 05/23/2018  . Hyperlipidemia, mild 04/08/2015  . Essential (primary) hypertension 03/04/2015  . Local edema 03/04/2015  . Abnormal LFTs 03/04/2015    No Known Allergies  Past Surgical History:  Procedure Laterality Date  . ABDOMINAL  HYSTERECTOMY     Right ovary remains  . TUBAL LIGATION      Social History   Tobacco Use  . Smoking status: Former Smoker    Packs/day: 1.00    Years: 23.00    Pack years: 23.00    Types: Cigarettes    Start date: 11/21/1977    Last attempt to quit: 11/21/2000    Years since quitting: 17.6  . Smokeless tobacco: Never Used  Substance Use Topics  . Alcohol use: Yes    Alcohol/week: 2.0 standard drinks    Types: 2 Standard drinks or equivalent per week  . Drug use: Not on file     Medication list has been reviewed and updated.  Current Meds  Medication Sig  . esomeprazole (NEXIUM 24HR) 20 MG capsule Take 1 capsule by mouth as needed.   . hydrochlorothiazide (HYDRODIURIL) 25 MG tablet TAKE 1 TABLET BY MOUTH DAILY  . losartan (COZAAR) 50 MG tablet Take 1 tablet (50 mg total) by mouth daily.  . [DISCONTINUED] losartan (COZAAR) 50 MG tablet Take 1 tablet (50 mg total) by mouth daily.    PHQ 2/9 Scores 07/04/2018 05/23/2018 05/19/2017 05/18/2016  PHQ - 2 Score 0 0 0 0    Physical Exam Vitals signs and nursing note reviewed.  Constitutional:      General: She is not in acute distress.    Appearance: She is well-developed.  HENT:     Head: Normocephalic and atraumatic.  Eyes:     Pupils: Pupils are equal, round, and reactive to light.  Neck:     Musculoskeletal: Normal range of motion and neck supple.  Cardiovascular:  Rate and Rhythm: Normal rate and regular rhythm.     Pulses: Normal pulses.     Heart sounds: Normal heart sounds.  Pulmonary:     Effort: Pulmonary effort is normal. No respiratory distress.  Abdominal:     General: Bowel sounds are normal.     Palpations: Abdomen is soft.     Tenderness: There is abdominal tenderness in the suprapubic area. There is no guarding or rebound.  Musculoskeletal: Normal range of motion.     Right lower leg: No edema.     Left lower leg: No edema.  Skin:    General: Skin is warm and dry.     Findings: No rash.    Neurological:     Mental Status: She is alert and oriented to person, place, and time.  Psychiatric:        Behavior: Behavior normal.        Thought Content: Thought content normal.     BP 136/84   Pulse 95   Ht 5\' 5"  (1.651 m)   Wt 176 lb 9.6 oz (80.1 kg)   SpO2 94%   BMI 29.39 kg/m   Assessment and Plan: 1. Essential (primary) hypertension Improved control Continue losartan and hctz - losartan (COZAAR) 50 MG tablet; Take 1 tablet (50 mg total) by mouth daily.  Dispense: 90 tablet; Refill: 3  2. Dysuria - POC urinalysis w microscopic (non auto) - sulfamethoxazole-trimethoprim (BACTRIM DS,SEPTRA DS) 800-160 MG tablet; Take 1 tablet by mouth 2 (two) times daily for 5 days.  Dispense: 10 tablet; Refill: 0   Partially dictated using Animal nutritionistDragon software. Any errors are unintentional.  Bari EdwardLaura Azarion Hove, MD [facility]  07/04/2018

## 2018-08-13 ENCOUNTER — Other Ambulatory Visit: Payer: Self-pay | Admitting: Internal Medicine

## 2018-08-13 DIAGNOSIS — I10 Essential (primary) hypertension: Secondary | ICD-10-CM

## 2019-04-02 ENCOUNTER — Other Ambulatory Visit: Payer: Self-pay | Admitting: Internal Medicine

## 2019-04-02 DIAGNOSIS — I10 Essential (primary) hypertension: Secondary | ICD-10-CM

## 2019-06-25 ENCOUNTER — Encounter: Payer: Self-pay | Admitting: Internal Medicine

## 2019-06-30 ENCOUNTER — Other Ambulatory Visit: Payer: Self-pay | Admitting: Internal Medicine

## 2019-06-30 DIAGNOSIS — I10 Essential (primary) hypertension: Secondary | ICD-10-CM

## 2019-07-29 NOTE — Progress Notes (Signed)
Date:  07/30/2019   Name:  Sherri Cox   DOB:  1961/04/19   MRN:  [id number]   Chief Complaint: Annual Exam (Breast Exam- no pap.) Sherri Cox is a 59 y.o. female who presents today for her Complete Annual Exam. She feels well. She reports exercising walking regularly. She reports she is sleeping well. She denies breast issues.  Mammogram  04/2019 Pap- discontinued Colonoguard  12/2016 Immunization History  Administered Date(s) Administered  . Influenza,inj,Quad PF,6+ Mos 02/11/2019  . Zoster Recombinat (Shingrix) 02/11/2019, 05/20/2019    Hypertension This is a chronic problem. The problem is unchanged. The problem is controlled. Pertinent negatives include no chest pain, headaches, palpitations or shortness of breath. Past treatments include diuretics and angiotensin blockers. The current treatment provides significant improvement.  Gastroesophageal Reflux She complains of heartburn. She reports no abdominal pain, no chest pain, no coughing or no wheezing. This is a recurrent problem. The problem occurs rarely. Pertinent negatives include no fatigue. She has tried a PPI for the symptoms. The treatment provided significant relief.    Lab Results  Component Value Date   CREATININE 0.71 05/23/2018   BUN 13 05/23/2018   NA 139 05/23/2018   K 3.8 05/23/2018   CL 94 (L) 05/23/2018   CO2 25 05/23/2018   Lab Results  Component Value Date   CHOL 278 (H) 05/23/2018   HDL 121 05/23/2018   LDLCALC 132 (H) 05/23/2018   TRIG 125 05/23/2018   CHOLHDL 2.3 05/23/2018   Lab Results  Component Value Date   TSH 2.320 05/23/2018   Lab Results  Component Value Date   HGBA1C 5.4 05/23/2018     Review of Systems  Constitutional: Negative for chills, fatigue and fever.  HENT: Negative for congestion, hearing loss, tinnitus, trouble swallowing and voice change.   Eyes: Negative for visual disturbance.  Respiratory: Negative for cough, chest tightness, shortness of breath and  wheezing.   Cardiovascular: Negative for chest pain, palpitations and leg swelling.  Gastrointestinal: Positive for heartburn. Negative for abdominal pain, constipation, diarrhea and vomiting.  Endocrine: Negative for polydipsia and polyuria.  Genitourinary: Negative for dysuria, frequency, genital sores, vaginal bleeding and vaginal discharge.  Musculoskeletal: Negative for arthralgias, gait problem and joint swelling.  Skin: Negative for color change and rash.  Neurological: Negative for dizziness, tremors, light-headedness and headaches.  Hematological: Negative for adenopathy. Does not bruise/bleed easily.  Psychiatric/Behavioral: Negative for dysphoric mood and sleep disturbance. The patient is not nervous/anxious.     Patient Active Problem List   Diagnosis Date Noted  . Elevated serum glucose 07/30/2019  . Gastroesophageal reflux disease without esophagitis 05/23/2018  . Hyperlipidemia, mild 04/08/2015  . Essential (primary) hypertension 03/04/2015  . Local edema 03/04/2015  . Abnormal LFTs 03/04/2015    No Known Allergies  Past Surgical History:  Procedure Laterality Date  . ABDOMINAL HYSTERECTOMY     Right ovary remains  . TUBAL LIGATION      Social History   Tobacco Use  . Smoking status: Former Smoker    Packs/day: 1.00    Years: 23.00    Pack years: 23.00    Types: Cigarettes    Start date: 11/21/1977    Quit date: 11/21/2000    Years since quitting: 18.6  . Smokeless tobacco: Never Used  Substance Use Topics  . Alcohol use: Yes    Alcohol/week: 2.0 standard drinks    Types: 2 Standard drinks or equivalent per week  . Drug use: Not on file  Medication list has been reviewed and updated.  Current Meds  Medication Sig  . hydrochlorothiazide (HYDRODIURIL) 25 MG tablet Take 1 tablet (25 mg total) by mouth daily.  Marland Kitchen losartan (COZAAR) 50 MG tablet TAKE 1 TABLET BY MOUTH EVERY DAY  . [DISCONTINUED] hydrochlorothiazide (HYDRODIURIL) 25 MG tablet TAKE 1  TABLET BY MOUTH EVERY DAY    PHQ 2/9 Scores 07/30/2019 07/04/2018 05/23/2018 05/19/2017  PHQ - 2 Score 0 0 0 0  PHQ- 9 Score 0 - - -    BP Readings from Last 3 Encounters:  07/30/19 126/88  07/04/18 136/84  05/23/18 (!) 148/92    Physical Exam Vitals and nursing note reviewed.  Constitutional:      General: She is not in acute distress.    Appearance: She is well-developed.  HENT:     Head: Normocephalic and atraumatic.     Right Ear: Tympanic membrane and ear canal normal.     Left Ear: Tympanic membrane and ear canal normal.     Nose:     Right Sinus: No maxillary sinus tenderness.     Left Sinus: No maxillary sinus tenderness.  Eyes:     General: No scleral icterus.       Right eye: No discharge.        Left eye: No discharge.     Conjunctiva/sclera: Conjunctivae normal.  Neck:     Thyroid: No thyromegaly.     Vascular: No carotid bruit.  Cardiovascular:     Rate and Rhythm: Normal rate and regular rhythm.     Pulses: Normal pulses.     Heart sounds: Normal heart sounds.  Pulmonary:     Effort: Pulmonary effort is normal. No respiratory distress.     Breath sounds: No wheezing.  Chest:     Breasts:        Right: No mass, nipple discharge, skin change or tenderness.        Left: No mass, nipple discharge, skin change or tenderness.  Abdominal:     General: Bowel sounds are normal. There is no distension.     Palpations: Abdomen is soft. There is no mass.     Tenderness: There is no abdominal tenderness.  Musculoskeletal:        General: Normal range of motion.     Cervical back: Normal range of motion. No erythema.     Right lower leg: No edema.     Left lower leg: No edema.  Lymphadenopathy:     Cervical: No cervical adenopathy.  Skin:    General: Skin is warm and dry.     Capillary Refill: Capillary refill takes less than 2 seconds.     Findings: No rash.  Neurological:     General: No focal deficit present.     Mental Status: She is alert and oriented to  person, place, and time.     Cranial Nerves: No cranial nerve deficit.     Sensory: No sensory deficit.     Deep Tendon Reflexes: Reflexes are normal and symmetric.  Psychiatric:        Attention and Perception: Attention normal.        Mood and Affect: Mood normal.        Speech: Speech normal.        Behavior: Behavior normal.        Thought Content: Thought content normal.     Wt Readings from Last 3 Encounters:  07/30/19 179 lb (81.2 kg)  07/04/18 176 lb 9.6  oz (80.1 kg)  05/23/18 176 lb (79.8 kg)    BP 126/88   Pulse 98   Temp 98.1 F (36.7 C) (Oral)   Ht 5\' 5"  (1.651 m)   Wt 179 lb (81.2 kg)   SpO2 96%   BMI 29.79 kg/m   Assessment and Plan: 1. Annual physical exam Normal exam Continue healthy diet, more regular exercise - POCT urinalysis dipstick  2. Encounter for screening mammogram for breast cancer Due in November - pt will schedule and call for orders  3. Essential (primary) hypertension Clinically stable exam with well controlled BP on losartan and hctz. Tolerating medications without side effects at this time. Pt to continue current regimen and low sodium diet; benefits of regular exercise as able discussed. - CBC with Differential/Platelet - TSH - hydrochlorothiazide (HYDRODIURIL) 25 MG tablet; Take 1 tablet (25 mg total) by mouth daily.  Dispense: 90 tablet; Refill: 3  4. Gastroesophageal reflux disease without esophagitis Symptoms well controlled on prn PPI No red flag signs such as weight loss, n/v, melena Will continue otc nexium. - CBC with Differential/Platelet  5. Hyperlipidemia, mild Will check and advise - Lipid panel  6. Abnormal LFTs Mild chronic increase noted - recheck today - Comprehensive metabolic panel  7. Elevated serum glucose - Hemoglobin A1c  8. Colon cancer screening Due for Cologuard 12/2019   Partially dictated using Dragon software. Any errors are unintentional.  01/2020, MD [facility]  07/30/2019

## 2019-07-30 ENCOUNTER — Encounter: Payer: Self-pay | Admitting: Internal Medicine

## 2019-07-30 ENCOUNTER — Other Ambulatory Visit: Payer: Self-pay

## 2019-07-30 ENCOUNTER — Ambulatory Visit (INDEPENDENT_AMBULATORY_CARE_PROVIDER_SITE_OTHER): Payer: 59 | Admitting: Internal Medicine

## 2019-07-30 VITALS — BP 126/88 | HR 98 | Temp 98.1°F | Ht 65.0 in | Wt 179.0 lb

## 2019-07-30 DIAGNOSIS — R7989 Other specified abnormal findings of blood chemistry: Secondary | ICD-10-CM

## 2019-07-30 DIAGNOSIS — Z1211 Encounter for screening for malignant neoplasm of colon: Secondary | ICD-10-CM | POA: Diagnosis not present

## 2019-07-30 DIAGNOSIS — E785 Hyperlipidemia, unspecified: Secondary | ICD-10-CM | POA: Diagnosis not present

## 2019-07-30 DIAGNOSIS — Z0000 Encounter for general adult medical examination without abnormal findings: Secondary | ICD-10-CM

## 2019-07-30 DIAGNOSIS — R739 Hyperglycemia, unspecified: Secondary | ICD-10-CM | POA: Insufficient documentation

## 2019-07-30 DIAGNOSIS — R945 Abnormal results of liver function studies: Secondary | ICD-10-CM | POA: Diagnosis not present

## 2019-07-30 DIAGNOSIS — K219 Gastro-esophageal reflux disease without esophagitis: Secondary | ICD-10-CM | POA: Diagnosis not present

## 2019-07-30 DIAGNOSIS — I10 Essential (primary) hypertension: Secondary | ICD-10-CM | POA: Diagnosis not present

## 2019-07-30 DIAGNOSIS — Z1231 Encounter for screening mammogram for malignant neoplasm of breast: Secondary | ICD-10-CM | POA: Diagnosis not present

## 2019-07-30 LAB — POCT URINALYSIS DIPSTICK
Bilirubin, UA: NEGATIVE
Blood, UA: NEGATIVE
Glucose, UA: NEGATIVE
Ketones, UA: NEGATIVE
Leukocytes, UA: NEGATIVE
Nitrite, UA: NEGATIVE
Protein, UA: NEGATIVE
Spec Grav, UA: <=1.005 — AB (ref 1.010–1.025)
Urobilinogen, UA: 0.2 E.U./dL
pH, UA: 7 (ref 5.0–8.0)

## 2019-07-30 MED ORDER — HYDROCHLOROTHIAZIDE 25 MG PO TABS: 25.0000 mg | ORAL_TABLET | Freq: Every day | ORAL | 3 refills | Status: DC

## 2019-07-30 NOTE — Patient Instructions (Addendum)
Covid Vaccine locations: [employer] Dept. [place] [phone] [email or web address]  Message me in August for your next Cologuard Kit.

## 2019-07-31 LAB — CBC WITH DIFFERENTIAL/PLATELET
Basophils Absolute: 0 10*3/uL (ref 0.0–0.2)
Basos: 1 %
EOS (ABSOLUTE): 0.2 10*3/uL (ref 0.0–0.4)
Eos: 4 %
Hematocrit: 42.7 % (ref 34.0–46.6)
Hemoglobin: 14.8 g/dL (ref 11.1–15.9)
Immature Grans (Abs): 0 10*3/uL (ref 0.0–0.1)
Immature Granulocytes: 0 %
Lymphocytes Absolute: 2.1 10*3/uL (ref 0.7–3.1)
Lymphs: 39 %
MCH: 31.2 pg (ref 26.6–33.0)
MCHC: 34.7 g/dL (ref 31.5–35.7)
MCV: 90 fL (ref 79–97)
Monocytes Absolute: 0.4 10*3/uL (ref 0.1–0.9)
Monocytes: 8 %
Neutrophils Absolute: 2.7 10*3/uL (ref 1.4–7.0)
Neutrophils: 48 %
Platelets: 268 10*3/uL (ref 150–450)
RBC: 4.74 x10E6/uL (ref 3.77–5.28)
RDW: 12.1 % (ref 11.7–15.4)
WBC: 5.5 10*3/uL (ref 3.4–10.8)

## 2019-07-31 LAB — COMPREHENSIVE METABOLIC PANEL
ALT: 49 IU/L — ABNORMAL HIGH (ref 0–32)
AST: 42 IU/L — ABNORMAL HIGH (ref 0–40)
Albumin/Globulin Ratio: 1.7 (ref 1.2–2.2)
Albumin: 5 g/dL — ABNORMAL HIGH (ref 3.8–4.9)
Alkaline Phosphatase: 68 IU/L (ref 39–117)
BUN/Creatinine Ratio: 16 (ref 9–23)
BUN: 12 mg/dL (ref 6–24)
Bilirubin Total: 0.9 mg/dL (ref 0.0–1.2)
CO2: 24 mmol/L (ref 20–29)
Calcium: 10.3 mg/dL — ABNORMAL HIGH (ref 8.7–10.2)
Chloride: 96 mmol/L (ref 96–106)
Creatinine, Ser: 0.73 mg/dL (ref 0.57–1.00)
GFR calc Af Amer: 104 mL/min/{1.73_m2} (ref >59)
GFR calc non Af Amer: 90 mL/min/{1.73_m2} (ref >59)
Globulin, Total: 2.9 g/dL (ref 1.5–4.5)
Glucose: 113 mg/dL — ABNORMAL HIGH (ref 65–99)
Potassium: 4.3 mmol/L (ref 3.5–5.2)
Sodium: 137 mmol/L (ref 134–144)
Total Protein: 7.9 g/dL (ref 6.0–8.5)

## 2019-07-31 LAB — TSH: TSH: 1.92 u[IU]/mL (ref 0.450–4.500)

## 2019-07-31 LAB — LIPID PANEL
Chol/HDL Ratio: 2.3 ratio (ref 0.0–4.4)
Cholesterol, Total: 242 mg/dL — ABNORMAL HIGH (ref 100–199)
HDL: 103 mg/dL (ref >39)
LDL Chol Calc (NIH): 112 mg/dL — ABNORMAL HIGH (ref 0–99)
Triglycerides: 162 mg/dL — ABNORMAL HIGH (ref 0–149)
VLDL Cholesterol Cal: 27 mg/dL (ref 5–40)

## 2019-07-31 LAB — HEMOGLOBIN A1C
Est. average glucose Bld gHb Est-mCnc: 111 mg/dL
Hgb A1c MFr Bld: 5.5 % (ref 4.8–5.6)

## 2020-01-14 ENCOUNTER — Telehealth: Payer: Self-pay | Admitting: Internal Medicine

## 2020-01-14 ENCOUNTER — Other Ambulatory Visit: Payer: Self-pay

## 2020-01-14 DIAGNOSIS — Z1211 Encounter for screening for malignant neoplasm of colon: Secondary | ICD-10-CM

## 2020-01-14 NOTE — Telephone Encounter (Signed)
Patient informed I placed order for cologuard.  CM

## 2020-01-14 NOTE — Telephone Encounter (Signed)
Copied from CRM [id number]. Topic: General - Inquiry >> Jan 14, 2020 10:48 AM Floria Raveling A wrote: Reason for CRM:  pt called in and stated it was time for her colorgaurd .  She would like to know if Dr Judithann Graves could order this for her  Best number -[phone]

## 2020-01-28 LAB — COLOGUARD: Cologuard: NEGATIVE

## 2020-02-03 LAB — COLOGUARD: COLOGUARD: NEGATIVE

## 2020-02-03 NOTE — Telephone Encounter (Signed)
Cologuard is negative.  Repeat in 3 yrs for screening purposes. 

## 2020-02-03 NOTE — Telephone Encounter (Signed)
Called pt left VM to call back. Called to give pt Cologuard results.  Will route result note to [facility] Nurse Triage for follow up when patient returns call to clinic.     KP

## 2020-02-03 NOTE — Telephone Encounter (Signed)
Patient notified of result- she is pleased

## 2020-03-26 ENCOUNTER — Other Ambulatory Visit: Payer: Self-pay | Admitting: Internal Medicine

## 2020-03-26 DIAGNOSIS — I10 Essential (primary) hypertension: Secondary | ICD-10-CM

## 2020-08-04 ENCOUNTER — Encounter: Payer: Self-pay | Admitting: Internal Medicine

## 2020-08-04 DIAGNOSIS — Z90710 Acquired absence of both cervix and uterus: Secondary | ICD-10-CM | POA: Insufficient documentation

## 2020-08-05 ENCOUNTER — Encounter: Payer: Self-pay | Admitting: Internal Medicine

## 2020-08-05 ENCOUNTER — Other Ambulatory Visit: Payer: Self-pay

## 2020-08-05 ENCOUNTER — Ambulatory Visit (INDEPENDENT_AMBULATORY_CARE_PROVIDER_SITE_OTHER): Payer: 59 | Admitting: Internal Medicine

## 2020-08-05 VITALS — BP 136/86 | HR 98 | Resp 14 | Ht 65.0 in | Wt 191.0 lb

## 2020-08-05 DIAGNOSIS — Z0000 Encounter for general adult medical examination without abnormal findings: Secondary | ICD-10-CM | POA: Diagnosis not present

## 2020-08-05 DIAGNOSIS — K219 Gastro-esophageal reflux disease without esophagitis: Secondary | ICD-10-CM | POA: Diagnosis not present

## 2020-08-05 DIAGNOSIS — I10 Essential (primary) hypertension: Secondary | ICD-10-CM

## 2020-08-05 DIAGNOSIS — E785 Hyperlipidemia, unspecified: Secondary | ICD-10-CM

## 2020-08-05 LAB — POCT URINALYSIS DIPSTICK
Bilirubin, UA: NEGATIVE
Blood, UA: NEGATIVE
Glucose, UA: NEGATIVE
Ketones, UA: NEGATIVE
Leukocytes, UA: NEGATIVE
Nitrite, UA: NEGATIVE
Protein, UA: NEGATIVE
Spec Grav, UA: 1.02 (ref 1.010–1.025)
Urobilinogen, UA: 0.2 E.U./dL
pH, UA: 6 (ref 5.0–8.0)

## 2020-08-05 MED ORDER — HYDROCHLOROTHIAZIDE 25 MG PO TABS: 25.0000 mg | ORAL_TABLET | Freq: Every day | ORAL | 3 refills | Status: DC

## 2020-08-05 NOTE — Progress Notes (Signed)
Date:  08/05/2020   Name:  Sherri Cox   DOB:  1961/06/02   MRN:  [id number]   Chief Complaint: Annual Exam (Breast Exam- mammo in Nov was normal. . Pap discontinued.)  Sherri Cox is a 60 y.o. female who presents today for her Complete Annual Exam. She feels well. She reports exercising - going to the gym 3 times weekly. She reports she is sleeping fairly well. Breast complaints - none.  Mammogram: 04/2020 DEXA: none Pap smear: discontinued Colonoscopy: Cologuard 01/2020  Immunization History  Administered Date(s) Administered  . Influenza,inj,Quad PF,6+ Mos 02/11/2019, 03/16/2020  . Moderna Sars-Covid-2 Vaccination 09/17/2019, 10/15/2019, 05/22/2020  . Zoster Recombinat (Shingrix) 02/11/2019, 05/20/2019    Hypertension This is a chronic problem. The problem is controlled (130/86). Pertinent negatives include no chest pain, headaches, palpitations or shortness of breath. Past treatments include angiotensin blockers. The current treatment provides significant improvement.    Lab Results  Component Value Date   CREATININE 0.73 07/30/2019   BUN 12 07/30/2019   NA 137 07/30/2019   K 4.3 07/30/2019   CL 96 07/30/2019   CO2 24 07/30/2019   Lab Results  Component Value Date   CHOL 242 (H) 07/30/2019   HDL 103 07/30/2019   LDLCALC 112 (H) 07/30/2019   TRIG 162 (H) 07/30/2019   CHOLHDL 2.3 07/30/2019   Lab Results  Component Value Date   TSH 1.920 07/30/2019   Lab Results  Component Value Date   HGBA1C 5.5 07/30/2019   Lab Results  Component Value Date   WBC 5.5 07/30/2019   HGB 14.8 07/30/2019   HCT 42.7 07/30/2019   MCV 90 07/30/2019   PLT 268 07/30/2019   Lab Results  Component Value Date   ALT 49 (H) 07/30/2019   AST 42 (H) 07/30/2019   ALKPHOS 68 07/30/2019   BILITOT 0.9 07/30/2019     Review of Systems  Constitutional: Negative for chills, fatigue and fever.  HENT: Negative for congestion, hearing loss, tinnitus, trouble swallowing and voice  change.   Eyes: Negative for visual disturbance.  Respiratory: Negative for cough, chest tightness, shortness of breath and wheezing.   Cardiovascular: Negative for chest pain, palpitations and leg swelling.  Gastrointestinal: Negative for abdominal pain, constipation, diarrhea and vomiting.  Endocrine: Negative for polydipsia and polyuria.  Genitourinary: Negative for dysuria, frequency, genital sores, vaginal bleeding and vaginal discharge.  Musculoskeletal: Negative for arthralgias, gait problem and joint swelling.  Skin: Negative for color change and rash.  Neurological: Negative for dizziness, tremors, light-headedness and headaches.  Hematological: Negative for adenopathy. Does not bruise/bleed easily.  Psychiatric/Behavioral: Negative for dysphoric mood and sleep disturbance. The patient is not nervous/anxious.     Patient Active Problem List   Diagnosis Date Noted  . S/P total hysterectomy 08/04/2020  . Gastroesophageal reflux disease without esophagitis 05/23/2018  . Hyperlipidemia, mild 04/08/2015  . Essential (primary) hypertension 03/04/2015  . Local edema 03/04/2015  . Abnormal LFTs 03/04/2015    No Known Allergies  Past Surgical History:  Procedure Laterality Date  . ABDOMINAL HYSTERECTOMY     Right ovary remains  . TUBAL LIGATION      Social History   Tobacco Use  . Smoking status: Former Smoker    Packs/day: 1.00    Years: 23.00    Pack years: 23.00    Types: Cigarettes    Start date: 11/21/1977    Quit date: 11/21/2000    Years since quitting: 19.7  . Smokeless tobacco: Never Used  Substance Use Topics  . Alcohol use: Yes    Alcohol/week: 2.0 standard drinks    Types: 2 Standard drinks or equivalent per week     Medication list has been reviewed and updated.  Current Meds  Medication Sig  . esomeprazole (NEXIUM) 20 MG capsule Take 1 capsule by mouth as needed.   . hydrochlorothiazide (HYDRODIURIL) 25 MG tablet Take 1 tablet (25 mg total) by mouth  daily.  Marland Kitchen losartan (COZAAR) 50 MG tablet TAKE 1 TABLET BY MOUTH EVERY DAY    PHQ 2/9 Scores 08/05/2020 07/30/2019 07/04/2018 05/23/2018  PHQ - 2 Score 0 0 0 0  PHQ- 9 Score 0 0 - -    GAD 7 : Generalized Anxiety Score 08/05/2020  Nervous, Anxious, on Edge 0  Control/stop worrying 0  Worry too much - different things 0  Trouble relaxing 0  Restless 0  Easily annoyed or irritable 0  Afraid - awful might happen 0  Total GAD 7 Score 0  Anxiety Difficulty Not difficult at all    BP Readings from Last 3 Encounters:  08/05/20 136/86  07/30/19 126/88  07/04/18 136/84    Physical Exam Vitals and nursing note reviewed.  Constitutional:      General: She is not in acute distress.    Appearance: She is well-developed.  HENT:     Head: Normocephalic and atraumatic.     Right Ear: Tympanic membrane and ear canal normal.     Left Ear: Tympanic membrane and ear canal normal.     Nose:     Right Sinus: No maxillary sinus tenderness.     Left Sinus: No maxillary sinus tenderness.  Eyes:     General: No scleral icterus.       Right eye: No discharge.        Left eye: No discharge.     Conjunctiva/sclera: Conjunctivae normal.  Neck:     Thyroid: No thyromegaly.     Vascular: No carotid bruit.  Cardiovascular:     Rate and Rhythm: Normal rate and regular rhythm.     Pulses: Normal pulses.     Heart sounds: Normal heart sounds.  Pulmonary:     Effort: Pulmonary effort is normal. No respiratory distress.     Breath sounds: No wheezing.  Chest:  Breasts:     Right: No mass, nipple discharge, skin change or tenderness.     Left: No mass, nipple discharge, skin change or tenderness.    Abdominal:     General: Bowel sounds are normal.     Palpations: Abdomen is soft.     Tenderness: There is no abdominal tenderness.  Musculoskeletal:     Cervical back: Normal range of motion. No erythema.     Right lower leg: No edema.     Left lower leg: No edema.  Lymphadenopathy:     Cervical:  No cervical adenopathy.  Skin:    General: Skin is warm and dry.     Findings: No rash.  Neurological:     Mental Status: She is alert and oriented to person, place, and time.     Cranial Nerves: No cranial nerve deficit.     Sensory: No sensory deficit.     Deep Tendon Reflexes: Reflexes are normal and symmetric.  Psychiatric:        Attention and Perception: Attention normal.        Mood and Affect: Mood normal.     Wt Readings from Last 3 Encounters:  08/05/20  191 lb (86.6 kg)  07/30/19 179 lb (81.2 kg)  07/04/18 176 lb 9.6 oz (80.1 kg)    BP 136/86   Pulse 98   Resp 14   Ht 5\' 5"  (1.651 m)   Wt 191 lb (86.6 kg)   SpO2 95%   BMI 31.78 kg/m   Assessment and Plan: 1. Annual physical exam Exam is normal except for weight. Encourage regular exercise and appropriate dietary changes. Mammogram due in November Immunizations up to date  2. Essential (primary) hypertension Clinically stable exam with well controlled BP. Tolerating medications without side effects at this time. Pt to continue current regimen and low sodium diet; benefits of regular exercise as able discussed. - Comprehensive metabolic panel - TSH - POCT urinalysis dipstick - hydrochlorothiazide (HYDRODIURIL) 25 MG tablet; Take 1 tablet (25 mg total) by mouth daily.  Dispense: 90 tablet; Refill: 3  3. Hyperlipidemia, mild Continue to work on low fat diet - Lipid panel  4. Gastroesophageal reflux disease without esophagitis Symptoms well controlled on PRN PPI No red flag signs such as weight loss, n/v, melena Will continue omeprazole. - CBC with Differential/Platelet   Partially dictated using December. Any errors are unintentional.  Animal nutritionist, MD [facility]  08/05/2020

## 2020-08-05 NOTE — Patient Instructions (Signed)

## 2020-08-06 LAB — LIPID PANEL
Chol/HDL Ratio: 2.7 ratio (ref 0.0–4.4)
Cholesterol, Total: 251 mg/dL — ABNORMAL HIGH (ref 100–199)
HDL: 92 mg/dL (ref >39)
LDL Chol Calc (NIH): 131 mg/dL — ABNORMAL HIGH (ref 0–99)
Triglycerides: 161 mg/dL — ABNORMAL HIGH (ref 0–149)
VLDL Cholesterol Cal: 28 mg/dL (ref 5–40)

## 2020-08-06 LAB — CBC WITH DIFFERENTIAL/PLATELET
Basophils Absolute: 0 10*3/uL (ref 0.0–0.2)
Basos: 1 %
EOS (ABSOLUTE): 0.2 10*3/uL (ref 0.0–0.4)
Eos: 4 %
Hematocrit: 42.7 % (ref 34.0–46.6)
Hemoglobin: 15.1 g/dL (ref 11.1–15.9)
Immature Grans (Abs): 0 10*3/uL (ref 0.0–0.1)
Immature Granulocytes: 0 %
Lymphocytes Absolute: 1.9 10*3/uL (ref 0.7–3.1)
Lymphs: 31 %
MCH: 32.3 pg (ref 26.6–33.0)
MCHC: 35.4 g/dL (ref 31.5–35.7)
MCV: 91 fL (ref 79–97)
Monocytes Absolute: 0.5 10*3/uL (ref 0.1–0.9)
Monocytes: 8 %
Neutrophils Absolute: 3.4 10*3/uL (ref 1.4–7.0)
Neutrophils: 56 %
Platelets: 241 10*3/uL (ref 150–450)
RBC: 4.67 x10E6/uL (ref 3.77–5.28)
RDW: 11.9 % (ref 11.7–15.4)
WBC: 6.1 10*3/uL (ref 3.4–10.8)

## 2020-08-06 LAB — COMPREHENSIVE METABOLIC PANEL
ALT: 66 IU/L — ABNORMAL HIGH (ref 0–32)
AST: 67 IU/L — ABNORMAL HIGH (ref 0–40)
Albumin/Globulin Ratio: 1.6 (ref 1.2–2.2)
Albumin: 4.7 g/dL (ref 3.8–4.9)
Alkaline Phosphatase: 64 IU/L (ref 44–121)
BUN/Creatinine Ratio: 20 (ref 12–28)
BUN: 14 mg/dL (ref 8–27)
Bilirubin Total: 0.8 mg/dL (ref 0.0–1.2)
CO2: 22 mmol/L (ref 20–29)
Calcium: 10 mg/dL (ref 8.7–10.3)
Chloride: 97 mmol/L (ref 96–106)
Creatinine, Ser: 0.71 mg/dL (ref 0.57–1.00)
GFR calc Af Amer: 107 mL/min/{1.73_m2} (ref >59)
GFR calc non Af Amer: 93 mL/min/{1.73_m2} (ref >59)
Globulin, Total: 3 g/dL (ref 1.5–4.5)
Glucose: 117 mg/dL — ABNORMAL HIGH (ref 65–99)
Potassium: 4.4 mmol/L (ref 3.5–5.2)
Sodium: 138 mmol/L (ref 134–144)
Total Protein: 7.7 g/dL (ref 6.0–8.5)

## 2020-08-06 LAB — TSH: TSH: 2.11 u[IU]/mL (ref 0.450–4.500)

## 2020-08-14 LAB — SPECIMEN STATUS REPORT

## 2020-08-14 LAB — HGB A1C W/O EAG: Hgb A1c MFr Bld: 5.6 % (ref 4.8–5.6)

## 2020-09-02 ENCOUNTER — Encounter: Payer: Self-pay | Admitting: Internal Medicine

## 2020-09-02 ENCOUNTER — Other Ambulatory Visit: Payer: Self-pay | Admitting: Internal Medicine

## 2020-09-02 DIAGNOSIS — I10 Essential (primary) hypertension: Secondary | ICD-10-CM

## 2020-09-02 MED ORDER — OLMESARTAN MEDOXOMIL 20 MG PO TABS: 20.0000 mg | ORAL_TABLET | Freq: Every day | ORAL | 1 refills | Status: DC

## 2020-12-24 ENCOUNTER — Other Ambulatory Visit: Payer: Self-pay | Admitting: Internal Medicine

## 2020-12-24 ENCOUNTER — Other Ambulatory Visit: Payer: Self-pay

## 2020-12-24 DIAGNOSIS — I10 Essential (primary) hypertension: Secondary | ICD-10-CM

## 2020-12-24 NOTE — Telephone Encounter (Signed)
  Notes to clinic:  The original prescription was discontinued on 09/02/2020 by Reubin Milan, MD for the following reason: Not available. Renewing this prescription may not be appropriate   Requested Prescriptions  Pending Prescriptions Disp Refills   losartan (COZAAR) 50 MG tablet [Pharmacy Med Name: LOSARTAN POTASSIUM 50 MG TAB] 90 tablet 2    Sig: TAKE 1 TABLET BY MOUTH EVERY DAY      Cardiovascular:  Angiotensin Receptor Blockers Passed - 12/24/2020  4:48 AM      Passed - Cr in normal range and within 180 days    Creatinine, Ser  Date Value Ref Range Status  08/05/2020 0.71 0.57 - 1.00 mg/dL Final          Passed - K in normal range and within 180 days    Potassium  Date Value Ref Range Status  08/05/2020 4.4 3.5 - 5.2 mmol/L Final          Passed - Patient is not pregnant      Passed - Last BP in normal range    BP Readings from Last 1 Encounters:  08/05/20 136/86          Passed - Valid encounter within last 6 months    Recent Outpatient Visits           4 months ago Annual physical exam   [facility] Reubin Milan, MD   1 year ago Annual physical exam   [facility] Reubin Milan, MD   2 years ago Essential (primary) hypertension   [facility] Reubin Milan, MD   2 years ago Annual physical exam   [facility] Reubin Milan, MD   3 years ago Annual physical exam   [facility] Reubin Milan, MD       Future Appointments             In 1 month Judithann Graves Nyoka Cowden, MD [facility]   In 7 months Judithann Graves, Nyoka Cowden, MD [facility], [facility]

## 2021-02-04 ENCOUNTER — Other Ambulatory Visit: Payer: Self-pay

## 2021-02-04 ENCOUNTER — Ambulatory Visit: Payer: 59 | Admitting: Internal Medicine

## 2021-02-04 ENCOUNTER — Encounter: Payer: Self-pay | Admitting: Internal Medicine

## 2021-02-04 VITALS — BP 130/75 | HR 87 | Temp 98.2°F | Ht 65.0 in | Wt 184.0 lb

## 2021-02-04 DIAGNOSIS — I10 Essential (primary) hypertension: Secondary | ICD-10-CM

## 2021-02-04 NOTE — Progress Notes (Signed)
Date:  02/04/2021   Name:  Sherri Cox   DOB:  05-17-1961   MRN:  [id number]   Chief Complaint: Hypertension (Pt currently taking olmesartan, but prefers to be back on losartan)  Hypertension This is a chronic problem. The current episode started more than 1 year ago. The problem is controlled. Pertinent negatives include no anxiety, blurred vision, chest pain, headaches, malaise/fatigue, palpitations, peripheral edema or shortness of breath. Past treatments include diuretics and angiotensin blockers. The current treatment provides significant improvement. There are no compliance problems.  There is no history of kidney disease, CAD/MI or CVA.  She has been taking Benicar when losartan was on back order.  It is more expensive so would like to go back to losartan.  Lab Results  Component Value Date   CREATININE 0.71 08/05/2020   BUN 14 08/05/2020   NA 138 08/05/2020   K 4.4 08/05/2020   CL 97 08/05/2020   CO2 22 08/05/2020   Lab Results  Component Value Date   CHOL 251 (H) 08/05/2020   HDL 92 08/05/2020   LDLCALC 131 (H) 08/05/2020   TRIG 161 (H) 08/05/2020   CHOLHDL 2.7 08/05/2020   Lab Results  Component Value Date   TSH 2.110 08/05/2020   Lab Results  Component Value Date   HGBA1C 5.6 08/05/2020   Lab Results  Component Value Date   WBC 6.1 08/05/2020   HGB 15.1 08/05/2020   HCT 42.7 08/05/2020   MCV 91 08/05/2020   PLT 241 08/05/2020   Lab Results  Component Value Date   ALT 66 (H) 08/05/2020   AST 67 (H) 08/05/2020   ALKPHOS 64 08/05/2020   BILITOT 0.8 08/05/2020     Review of Systems  Constitutional:  Negative for chills, fatigue, malaise/fatigue and unexpected weight change.  HENT:  Negative for nosebleeds.   Eyes:  Negative for blurred vision and visual disturbance.  Respiratory:  Negative for cough, chest tightness, shortness of breath and wheezing.   Cardiovascular:  Negative for chest pain, palpitations and leg swelling.  Gastrointestinal:   Negative for abdominal pain, constipation and diarrhea.  Neurological:  Negative for dizziness, weakness, light-headedness and headaches.   Patient Active Problem List   Diagnosis Date Noted   S/P total hysterectomy 08/04/2020   Gastroesophageal reflux disease without esophagitis 05/23/2018   Hyperlipidemia, mild 04/08/2015   Essential (primary) hypertension 03/04/2015   Local edema 03/04/2015   Abnormal LFTs 03/04/2015    No Known Allergies  Past Surgical History:  Procedure Laterality Date   ABDOMINAL HYSTERECTOMY     Right ovary remains   TUBAL LIGATION      Social History   Tobacco Use   Smoking status: Former    Packs/day: 1.00    Years: 23.00    Pack years: 23.00    Types: Cigarettes    Start date: 11/21/1977    Quit date: 11/21/2000    Years since quitting: 20.2   Smokeless tobacco: Never  Substance Use Topics   Alcohol use: Yes    Alcohol/week: 2.0 standard drinks    Types: 2 Standard drinks or equivalent per week   Drug use: Never     Medication list has been reviewed and updated.  Current Meds  Medication Sig   esomeprazole (NEXIUM) 20 MG capsule Take 1 capsule by mouth as needed.    hydrochlorothiazide (HYDRODIURIL) 25 MG tablet Take 1 tablet (25 mg total) by mouth daily.   olmesartan (BENICAR) 20 MG tablet Take 1 tablet (  20 mg total) by mouth daily.    PHQ 2/9 Scores 02/04/2021 08/05/2020 07/30/2019 07/04/2018  PHQ - 2 Score 0 0 0 0  PHQ- 9 Score 0 0 0 -    GAD 7 : Generalized Anxiety Score 02/04/2021 08/05/2020  Nervous, Anxious, on Edge 0 0  Control/stop worrying 0 0  Worry too much - different things 0 0  Trouble relaxing 0 0  Restless 0 0  Easily annoyed or irritable 0 0  Afraid - awful might happen 0 0  Total GAD 7 Score 0 0  Anxiety Difficulty - Not difficult at all    BP Readings from Last 3 Encounters:  02/04/21 130/88  08/05/20 136/86  07/30/19 126/88    Physical Exam Vitals and nursing note reviewed.  Constitutional:       General: She is not in acute distress.    Appearance: She is well-developed.  HENT:     Head: Normocephalic and atraumatic.  Cardiovascular:     Rate and Rhythm: Normal rate and regular rhythm.     Pulses: Normal pulses.     Heart sounds: No murmur heard. Pulmonary:     Effort: Pulmonary effort is normal. No respiratory distress.     Breath sounds: No wheezing or rhonchi.  Musculoskeletal:     Right lower leg: No edema.     Left lower leg: No edema.  Skin:    General: Skin is warm and dry.     Capillary Refill: Capillary refill takes less than 2 seconds.     Findings: No rash.  Neurological:     General: No focal deficit present.     Mental Status: She is alert and oriented to person, place, and time.  Psychiatric:        Mood and Affect: Mood normal.        Behavior: Behavior normal.    Wt Readings from Last 3 Encounters:  02/04/21 184 lb (83.5 kg)  08/05/20 191 lb (86.6 kg)  07/30/19 179 lb (81.2 kg)    BP 130/88   Pulse 87   Temp 98.2 F (36.8 C) (Oral)   Ht 5\' 5"  (1.651 m)   Wt 184 lb (83.5 kg)   SpO2 94%   BMI 30.62 kg/m   Assessment and Plan: 1. Essential (primary) hypertension Clinically stable exam with well controlled BP. Tolerating medications without side effects at this time.  Finish benicar then resume losartan. Continue HCTZ. Pt to continue current regimen and low sodium diet; benefits of regular exercise as able discussed.    Partially dictated using . Any errors are unintentional.  Animal nutritionist, MD [facility]  02/04/2021

## 2021-04-12 ENCOUNTER — Other Ambulatory Visit: Payer: Self-pay | Admitting: Internal Medicine

## 2021-04-12 ENCOUNTER — Encounter: Payer: Self-pay | Admitting: Internal Medicine

## 2021-04-12 DIAGNOSIS — I10 Essential (primary) hypertension: Secondary | ICD-10-CM

## 2021-04-12 MED ORDER — LOSARTAN POTASSIUM 50 MG PO TABS: 50.0000 mg | ORAL_TABLET | Freq: Every day | ORAL | 1 refills | Status: DC

## 2021-08-08 ENCOUNTER — Other Ambulatory Visit: Payer: Self-pay | Admitting: Internal Medicine

## 2021-08-08 DIAGNOSIS — I10 Essential (primary) hypertension: Secondary | ICD-10-CM

## 2021-08-09 NOTE — Telephone Encounter (Signed)
Requested medication (s) are due for refill today:   Yes  Requested medication (s) are on the active medication list:   Yes  Future visit scheduled:   Yes in 2 days   Last ordered: 08/05/2020 #90, 3 refills  Returned because protocol criteria not met.   Labs due.      Requested Prescriptions  Pending Prescriptions Disp Refills   hydrochlorothiazide (HYDRODIURIL) 25 MG tablet [Pharmacy Med Name: HYDROCHLOROTHIAZIDE 25 MG TAB] 90 tablet 3    Sig: Take 1 tablet (25 mg total) by mouth daily.     Cardiovascular: Diuretics - Thiazide Failed - 08/08/2021  9:03 AM      Failed - Cr in normal range and within 180 days    Creatinine, Ser  Date Value Ref Range Status  08/05/2020 0.71 0.57 - 1.00 mg/dL Final          Failed - K in normal range and within 180 days    Potassium  Date Value Ref Range Status  08/05/2020 4.4 3.5 - 5.2 mmol/L Final          Failed - Na in normal range and within 180 days    Sodium  Date Value Ref Range Status  08/05/2020 138 134 - 144 mmol/L Final          Failed - Valid encounter within last 6 months    Recent Outpatient Visits           6 months ago Essential (primary) hypertension   [facility] Glean Hess, MD   1 year ago Annual physical exam   [facility] Glean Hess, MD   2 years ago Annual physical exam   [facility] Glean Hess, MD   3 years ago Essential (primary) hypertension   [facility] Glean Hess, MD   3 years ago Annual physical exam   [facility] Glean Hess, MD       Future Appointments             In 2 days Glean Hess, MD [facility], [place] BP in normal range    BP Readings from Last 1 Encounters:  02/04/21 130/75

## 2021-08-11 ENCOUNTER — Other Ambulatory Visit: Payer: Self-pay

## 2021-08-11 ENCOUNTER — Ambulatory Visit (INDEPENDENT_AMBULATORY_CARE_PROVIDER_SITE_OTHER): Payer: 59 | Admitting: Internal Medicine

## 2021-08-11 ENCOUNTER — Encounter: Payer: Self-pay | Admitting: Internal Medicine

## 2021-08-11 VITALS — BP 122/86 | HR 88 | Ht 65.0 in | Wt 185.0 lb

## 2021-08-11 DIAGNOSIS — I10 Essential (primary) hypertension: Secondary | ICD-10-CM | POA: Diagnosis not present

## 2021-08-11 DIAGNOSIS — E785 Hyperlipidemia, unspecified: Secondary | ICD-10-CM

## 2021-08-11 DIAGNOSIS — S76012A Strain of muscle, fascia and tendon of left hip, initial encounter: Secondary | ICD-10-CM

## 2021-08-11 DIAGNOSIS — K219 Gastro-esophageal reflux disease without esophagitis: Secondary | ICD-10-CM | POA: Diagnosis not present

## 2021-08-11 DIAGNOSIS — Z0000 Encounter for general adult medical examination without abnormal findings: Secondary | ICD-10-CM | POA: Diagnosis not present

## 2021-08-11 LAB — POCT URINALYSIS DIPSTICK
Bilirubin, UA: NEGATIVE
Blood, UA: NEGATIVE
Glucose, UA: NEGATIVE
Ketones, UA: NEGATIVE
Leukocytes, UA: NEGATIVE
Nitrite, UA: NEGATIVE
Protein, UA: NEGATIVE
Spec Grav, UA: <=1.005 — AB (ref 1.010–1.025)
Urobilinogen, UA: 0.2 E.U./dL
pH, UA: 6 (ref 5.0–8.0)

## 2021-08-11 NOTE — Progress Notes (Signed)
Date:  08/11/2021   Name:  Sherri Cox   DOB:  Dec 09, 1960   MRN:  [id number]   Chief Complaint: Annual Exam (Breast exam no pap) Sherri Cox is a 61 y.o. female who presents today for her Complete Annual Exam. She feels well. She reports exercising gym 3 times a week. She reports she is sleeping well. Breast complaints none.  Mammogram: 04/2021 DEXA: none Pap smear: discontinued Colonoscopy: Cologuard 01/2021  Immunization History  Administered Date(s) Administered   Influenza,inj,Quad PF,6+ Mos 02/11/2019, 03/16/2020, 04/05/2021   Moderna Sars-Covid-2 Vaccination 09/17/2019, 10/15/2019, 05/18/2020, 10/26/2020   Zoster Recombinat (Shingrix) 02/11/2019, 05/20/2019    Hypertension This is a chronic problem. The problem is controlled. Pertinent negatives include no chest pain, headaches, palpitations or shortness of breath. Past treatments include angiotensin blockers and diuretics. There is no history of kidney disease, CAD/MI or CVA.  Gastroesophageal Reflux She reports no abdominal pain, no chest pain, no coughing or no wheezing. This is a recurrent problem. The problem occurs rarely. Pertinent negatives include no fatigue. She has tried a PPI for the symptoms.  Hip Pain  There was no injury mechanism. The pain is present in the left hip. The quality of the pain is described as aching. The pain is mild. The pain has been Improving since onset. She has tried NSAIDs for the symptoms. The treatment provided moderate relief.   Lab Results  Component Value Date   NA 138 08/05/2020   K 4.4 08/05/2020   CO2 22 08/05/2020   GLUCOSE 117 (H) 08/05/2020   BUN 14 08/05/2020   CREATININE 0.71 08/05/2020   CALCIUM 10.0 08/05/2020   GFRNONAA 93 08/05/2020   Lab Results  Component Value Date   CHOL 251 (H) 08/05/2020   HDL 92 08/05/2020   LDLCALC 131 (H) 08/05/2020   TRIG 161 (H) 08/05/2020   CHOLHDL 2.7 08/05/2020   Lab Results  Component Value Date   TSH 2.110 08/05/2020    Lab Results  Component Value Date   HGBA1C 5.6 08/05/2020   Lab Results  Component Value Date   WBC 6.1 08/05/2020   HGB 15.1 08/05/2020   HCT 42.7 08/05/2020   MCV 91 08/05/2020   PLT 241 08/05/2020   Lab Results  Component Value Date   ALT 66 (H) 08/05/2020   AST 67 (H) 08/05/2020   ALKPHOS 64 08/05/2020   BILITOT 0.8 08/05/2020   No results found for: 25OHVITD2, 25OHVITD3, VD25OH   Review of Systems  Constitutional:  Negative for chills, fatigue and fever.  HENT:  Negative for congestion, hearing loss, tinnitus, trouble swallowing and voice change.   Eyes:  Negative for visual disturbance.  Respiratory:  Negative for cough, chest tightness, shortness of breath and wheezing.   Cardiovascular:  Negative for chest pain, palpitations and leg swelling.  Gastrointestinal:  Negative for abdominal pain, constipation, diarrhea and vomiting.  Endocrine: Negative for polydipsia and polyuria.  Genitourinary:  Negative for dysuria, frequency, genital sores, vaginal bleeding and vaginal discharge.  Musculoskeletal:  Positive for arthralgias (left hip and leg). Negative for gait problem and joint swelling.  Skin:  Negative for color change and rash.  Neurological:  Negative for dizziness, tremors, light-headedness and headaches.  Hematological:  Negative for adenopathy. Does not bruise/bleed easily.  Psychiatric/Behavioral:  Negative for dysphoric mood and sleep disturbance. The patient is not nervous/anxious.    Patient Active Problem List   Diagnosis Date Noted   S/P total hysterectomy 08/04/2020   Gastroesophageal reflux disease  without esophagitis 05/23/2018   Hyperlipidemia, mild 04/08/2015   Essential (primary) hypertension 03/04/2015   Local edema 03/04/2015   Abnormal LFTs 03/04/2015    No Known Allergies  Past Surgical History:  Procedure Laterality Date   ABDOMINAL HYSTERECTOMY     Right ovary remains   TUBAL LIGATION      Social History   Tobacco Use    Smoking status: Former    Packs/day: 1.00    Years: 23.00    Pack years: 23.00    Types: Cigarettes    Start date: 11/21/1977    Quit date: 11/21/2000    Years since quitting: 20.7   Smokeless tobacco: Never  Substance Use Topics   Alcohol use: Yes    Alcohol/week: 2.0 standard drinks    Types: 2 Standard drinks or equivalent per week   Drug use: Never     Medication list has been reviewed and updated.  Current Meds  Medication Sig   esomeprazole (NEXIUM) 20 MG capsule Take 1 capsule by mouth as needed.    hydrochlorothiazide (HYDRODIURIL) 25 MG tablet TAKE 1 TABLET (25 MG TOTAL) BY MOUTH DAILY.   losartan (COZAAR) 50 MG tablet Take 1 tablet (50 mg total) by mouth daily.    PHQ 2/9 Scores 08/11/2021 02/04/2021 08/05/2020 07/30/2019  PHQ - 2 Score 0 0 0 0  PHQ- 9 Score 0 0 0 0    GAD 7 : Generalized Anxiety Score 08/11/2021 02/04/2021 08/05/2020  Nervous, Anxious, on Edge 0 0 0  Control/stop worrying 0 0 0  Worry too much - different things 0 0 0  Trouble relaxing 0 0 0  Restless 0 0 0  Easily annoyed or irritable 0 0 0  Afraid - awful might happen 0 0 0  Total GAD 7 Score 0 0 0  Anxiety Difficulty - - Not difficult at all    BP Readings from Last 3 Encounters:  08/11/21 122/86  02/04/21 130/75  08/05/20 136/86    Physical Exam Vitals and nursing note reviewed.  Constitutional:      General: She is not in acute distress.    Appearance: She is well-developed.  HENT:     Head: Normocephalic and atraumatic.     Right Ear: Tympanic membrane and ear canal normal.     Left Ear: Tympanic membrane and ear canal normal.     Nose:     Right Sinus: No maxillary sinus tenderness.     Left Sinus: No maxillary sinus tenderness.  Eyes:     General: No scleral icterus.       Right eye: No discharge.        Left eye: No discharge.     Conjunctiva/sclera: Conjunctivae normal.  Neck:     Thyroid: No thyromegaly.     Vascular: No carotid bruit.  Cardiovascular:     Rate and  Rhythm: Normal rate and regular rhythm.     Pulses: Normal pulses.     Heart sounds: Normal heart sounds.  Pulmonary:     Effort: Pulmonary effort is normal. No respiratory distress.     Breath sounds: No wheezing.  Chest:  Breasts:    Right: No mass, nipple discharge, skin change or tenderness.     Left: No mass, nipple discharge, skin change or tenderness.  Abdominal:     General: Bowel sounds are normal.     Palpations: Abdomen is soft.     Tenderness: There is no abdominal tenderness.  Musculoskeletal:  General: Normal range of motion.     Cervical back: Normal range of motion. No erythema.     Lumbar back: Normal. No tenderness or bony tenderness. Negative right straight leg raise test and negative left straight leg raise test.     Right hip: No tenderness. Normal range of motion.     Left hip: No tenderness. Normal range of motion.     Right lower leg: No edema.     Left lower leg: No edema.     Comments: Bony tenderness over the ischium on left  Lymphadenopathy:     Cervical: No cervical adenopathy.  Skin:    General: Skin is warm and dry.     Capillary Refill: Capillary refill takes less than 2 seconds.     Findings: No rash.  Neurological:     General: No focal deficit present.     Mental Status: She is alert and oriented to person, place, and time.     Cranial Nerves: No cranial nerve deficit.     Sensory: No sensory deficit.     Deep Tendon Reflexes: Reflexes are normal and symmetric.  Psychiatric:        Attention and Perception: Attention normal.        Mood and Affect: Mood normal.    Wt Readings from Last 3 Encounters:  08/11/21 185 lb (83.9 kg)  02/04/21 184 lb (83.5 kg)  08/05/20 191 lb (86.6 kg)    BP 122/86    Pulse 88    Ht 5\' 5"  (1.651 m)    Wt 185 lb (83.9 kg)    SpO2 95%    BMI 30.79 kg/m   Assessment and Plan: 1. Annual physical exam Exam is normal except for weight. Encourage regular exercise and appropriate dietary changes. Continue  with gym exercise. Up to date on screenings and immunizations. - Hemoglobin A1c  2. Essential (primary) hypertension Clinically stable exam with well controlled BP. Tolerating medications without side effects at this time. Pt to continue current regimen and low sodium diet; benefits of regular exercise as able discussed. - Comprehensive metabolic panel - POCT urinalysis dipstick - TSH  3. Gastroesophageal reflux disease without esophagitis Symptoms well controlled on daily PPI No red flag signs such as weight loss, n/v, melena Will continue nexium - CBC with Differential/Platelet  4. Hyperlipidemia, mild Check labs and advise. - Lipid panel  5. Hip strain, left, initial encounter Continue Advil as needed Vary gym exercise to avoid excess strain Follow up if symptoms persist   Partially dictated using Editor, commissioning. Any errors are unintentional.  Halina Maidens, MD [facility]  08/11/2021

## 2021-08-12 LAB — COMPREHENSIVE METABOLIC PANEL
ALT: 65 IU/L — ABNORMAL HIGH (ref 0–32)
AST: 62 IU/L — ABNORMAL HIGH (ref 0–40)
Albumin/Globulin Ratio: 1.8 (ref 1.2–2.2)
Albumin: 4.9 g/dL — ABNORMAL HIGH (ref 3.8–4.8)
Alkaline Phosphatase: 68 IU/L (ref 44–121)
BUN/Creatinine Ratio: 16 (ref 12–28)
BUN: 11 mg/dL (ref 8–27)
Bilirubin Total: 0.9 mg/dL (ref 0.0–1.2)
CO2: 24 mmol/L (ref 20–29)
Calcium: 9.7 mg/dL (ref 8.7–10.3)
Chloride: 96 mmol/L (ref 96–106)
Creatinine, Ser: 0.68 mg/dL (ref 0.57–1.00)
Globulin, Total: 2.8 g/dL (ref 1.5–4.5)
Glucose: 103 mg/dL — ABNORMAL HIGH (ref 70–99)
Potassium: 4.1 mmol/L (ref 3.5–5.2)
Sodium: 136 mmol/L (ref 134–144)
Total Protein: 7.7 g/dL (ref 6.0–8.5)
eGFR: 99 mL/min/{1.73_m2} (ref >59)

## 2021-08-12 LAB — CBC WITH DIFFERENTIAL/PLATELET
Basophils Absolute: 0 10*3/uL (ref 0.0–0.2)
Basos: 1 %
EOS (ABSOLUTE): 0.2 10*3/uL (ref 0.0–0.4)
Eos: 4 %
Hematocrit: 42.9 % (ref 34.0–46.6)
Hemoglobin: 14.6 g/dL (ref 11.1–15.9)
Immature Grans (Abs): 0 10*3/uL (ref 0.0–0.1)
Immature Granulocytes: 0 %
Lymphocytes Absolute: 2.3 10*3/uL (ref 0.7–3.1)
Lymphs: 39 %
MCH: 31.7 pg (ref 26.6–33.0)
MCHC: 34 g/dL (ref 31.5–35.7)
MCV: 93 fL (ref 79–97)
Monocytes Absolute: 0.4 10*3/uL (ref 0.1–0.9)
Monocytes: 7 %
Neutrophils Absolute: 3 10*3/uL (ref 1.4–7.0)
Neutrophils: 49 %
Platelets: 242 10*3/uL (ref 150–450)
RBC: 4.6 x10E6/uL (ref 3.77–5.28)
RDW: 11.9 % (ref 11.7–15.4)
WBC: 6 10*3/uL (ref 3.4–10.8)

## 2021-08-12 LAB — LIPID PANEL
Chol/HDL Ratio: 2.4 ratio (ref 0.0–4.4)
Cholesterol, Total: 261 mg/dL — ABNORMAL HIGH (ref 100–199)
HDL: 107 mg/dL (ref >39)
LDL Chol Calc (NIH): 131 mg/dL — ABNORMAL HIGH (ref 0–99)
Triglycerides: 135 mg/dL (ref 0–149)
VLDL Cholesterol Cal: 23 mg/dL (ref 5–40)

## 2021-08-12 LAB — TSH: TSH: 2 u[IU]/mL (ref 0.450–4.500)

## 2021-08-12 LAB — HEMOGLOBIN A1C
Est. average glucose Bld gHb Est-mCnc: 117 mg/dL
Hgb A1c MFr Bld: 5.7 % — ABNORMAL HIGH (ref 4.8–5.6)

## 2021-09-29 ENCOUNTER — Other Ambulatory Visit: Payer: Self-pay | Admitting: Internal Medicine

## 2021-09-29 DIAGNOSIS — I10 Essential (primary) hypertension: Secondary | ICD-10-CM

## 2021-09-30 NOTE — Telephone Encounter (Signed)
Requested Prescriptions  ?Pending Prescriptions Disp Refills  ?? losartan (COZAAR) 50 MG tablet [Pharmacy Med Name: LOSARTAN POTASSIUM 50 MG TAB] 90 tablet 1  ?  Sig: TAKE 1 TABLET BY MOUTH EVERY DAY  ?  ? Cardiovascular:  Angiotensin Receptor Blockers Passed - 09/29/2021  7:22 PM  ?  ?  Passed - Cr in normal range and within 180 days  ?  Creatinine, Ser  ?Date Value Ref Range Status  ?08/11/2021 0.68 0.57 - 1.00 mg/dL Final  ?   ?  ?  Passed - K in normal range and within 180 days  ?  Potassium  ?Date Value Ref Range Status  ?08/11/2021 4.1 3.5 - 5.2 mmol/L Final  ?   ?  ?  Passed - Patient is not pregnant  ?  ?  Passed - Last BP in normal range  ?  BP Readings from Last 1 Encounters:  ?08/11/21 122/86  ?   ?  ?  Passed - Valid encounter within last 6 months  ?  Recent Outpatient Visits   ?      ? 1 month ago Annual physical exam  ? [facility] Reubin Milan, MD  ? 7 months ago Essential (primary) hypertension  ? [facility] Reubin Milan, MD  ? 1 year ago Annual physical exam  ? [facility] Reubin Milan, MD  ? 2 years ago Annual physical exam  ? [facility] Reubin Milan, MD  ? 3 years ago Essential (primary) hypertension  ? [facility] Reubin Milan, MD  ?  ?  ?Future Appointments   ?        ? In 4 months Judithann Graves Nyoka Cowden, MD [facility]  ? In 10 months Reubin Milan, MD [facility]  ?  ? ?  ?  ?  ? ?

## 2021-11-04 ENCOUNTER — Other Ambulatory Visit: Payer: Self-pay | Admitting: Internal Medicine

## 2021-11-04 DIAGNOSIS — I10 Essential (primary) hypertension: Secondary | ICD-10-CM

## 2021-11-04 NOTE — Telephone Encounter (Signed)
Requested Prescriptions  Pending Prescriptions Disp Refills  . hydrochlorothiazide (HYDRODIURIL) 25 MG tablet [Pharmacy Med Name: HYDROCHLOROTHIAZIDE 25 MG TAB] 90 tablet 0    Sig: TAKE 1 TABLET (25 MG TOTAL) BY MOUTH DAILY.     Cardiovascular: Diuretics - Thiazide Passed - 11/04/2021  2:03 AM      Passed - Cr in normal range and within 180 days    Creatinine, Ser  Date Value Ref Range Status  08/11/2021 0.68 0.57 - 1.00 mg/dL Final         Passed - K in normal range and within 180 days    Potassium  Date Value Ref Range Status  08/11/2021 4.1 3.5 - 5.2 mmol/L Final         Passed - Na in normal range and within 180 days    Sodium  Date Value Ref Range Status  08/11/2021 136 134 - 144 mmol/L Final         Passed - Last BP in normal range    BP Readings from Last 1 Encounters:  08/11/21 122/86         Passed - Valid encounter within last 6 months    Recent Outpatient Visits          2 months ago Annual physical exam   [facility] Reubin Milan, MD   9 months ago Essential (primary) hypertension   [facility] Reubin [facility], MD   1 year ago Annual physical exam   [facility] Reubin Milan, MD   2 years ago Annual physical exam   [facility] Reubin Milan, MD   3 years ago Essential (primary) hypertension   [facility] Reubin Milan, MD      Future Appointments            In 3 months Judithann Graves Nyoka Cowden, MD [facility]   In 9 months Judithann Graves, Nyoka Cowden, MD [facility], [facility]

## 2022-02-01 ENCOUNTER — Other Ambulatory Visit: Payer: Self-pay | Admitting: Internal Medicine

## 2022-02-01 DIAGNOSIS — I10 Essential (primary) hypertension: Secondary | ICD-10-CM

## 2022-02-01 NOTE — Telephone Encounter (Signed)
Requested Prescriptions  Pending Prescriptions Disp Refills  . hydrochlorothiazide (HYDRODIURIL) 25 MG tablet [Pharmacy Med Name: HYDROCHLOROTHIAZIDE 25 MG TAB] 90 tablet 0    Sig: TAKE 1 TABLET (25 MG TOTAL) BY MOUTH DAILY.     Cardiovascular: Diuretics - Thiazide Passed - 02/01/2022  2:09 AM      Passed - Cr in normal range and within 180 days    Creatinine, Ser  Date Value Ref Range Status  08/11/2021 0.68 0.57 - 1.00 mg/dL Final         Passed - K in normal range and within 180 days    Potassium  Date Value Ref Range Status  08/11/2021 4.1 3.5 - 5.2 mmol/L Final         Passed - Na in normal range and within 180 days    Sodium  Date Value Ref Range Status  08/11/2021 136 134 - 144 mmol/L Final         Passed - Last BP in normal range    BP Readings from Last 1 Encounters:  08/11/21 122/86         Passed - Valid encounter within last 6 months    Recent Outpatient Visits          5 months ago Annual physical exam   [facility] Reubin Milan, MD   12 months ago Essential (primary) hypertension   [facility] Reubin Milan, MD   1 year ago Annual physical exam   [facility] Reubin Milan, MD   2 years ago Annual physical exam   [facility] Reubin Milan, MD   3 years ago Essential (primary) hypertension   [facility] Reubin Milan, MD      Future Appointments            In 1 week Judithann Graves Nyoka Cowden, MD [facility]   In 6 months Judithann Graves, Nyoka Cowden, MD [facility], [facility]

## 2022-02-08 ENCOUNTER — Encounter: Payer: Self-pay | Admitting: Internal Medicine

## 2022-02-08 ENCOUNTER — Ambulatory Visit (INDEPENDENT_AMBULATORY_CARE_PROVIDER_SITE_OTHER): Payer: 59 | Admitting: Internal Medicine

## 2022-02-08 VITALS — BP 128/70 | HR 82 | Ht 65.0 in | Wt 186.0 lb

## 2022-02-08 DIAGNOSIS — R7303 Prediabetes: Secondary | ICD-10-CM

## 2022-02-08 DIAGNOSIS — I10 Essential (primary) hypertension: Secondary | ICD-10-CM

## 2022-02-08 MED ORDER — HYDROCHLOROTHIAZIDE 25 MG PO TABS: 25.0000 mg | ORAL_TABLET | Freq: Every day | ORAL | 0 refills | Status: DC

## 2022-02-08 MED ORDER — LOSARTAN POTASSIUM 50 MG PO TABS: 50.0000 mg | ORAL_TABLET | Freq: Every day | ORAL | 3 refills | Status: DC

## 2022-02-08 NOTE — Progress Notes (Signed)
Date:  02/08/2022   Name:  Sherri Cox   DOB:  January 01, 1961   MRN:  [id number]   Chief Complaint: Hypertension  Hypertension This is a chronic problem. The problem is controlled. Pertinent negatives include no chest pain, headaches, palpitations or shortness of breath. Past treatments include angiotensin blockers and diuretics.    Lab Results  Component Value Date   NA 136 08/11/2021   K 4.1 08/11/2021   CO2 24 08/11/2021   GLUCOSE 103 (H) 08/11/2021   BUN 11 08/11/2021   CREATININE 0.68 08/11/2021   CALCIUM 9.7 08/11/2021   EGFR 99 08/11/2021   GFRNONAA 93 08/05/2020   Lab Results  Component Value Date   CHOL 261 (H) 08/11/2021   HDL 107 08/11/2021   LDLCALC 131 (H) 08/11/2021   TRIG 135 08/11/2021   CHOLHDL 2.4 08/11/2021   Lab Results  Component Value Date   TSH 2.000 08/11/2021   Lab Results  Component Value Date   HGBA1C 5.7 (H) 08/11/2021   Lab Results  Component Value Date   WBC 6.0 08/11/2021   HGB 14.6 08/11/2021   HCT 42.9 08/11/2021   MCV 93 08/11/2021   PLT 242 08/11/2021   Lab Results  Component Value Date   ALT 65 (H) 08/11/2021   AST 62 (H) 08/11/2021   ALKPHOS 68 08/11/2021   BILITOT 0.9 08/11/2021   No results found for: "25OHVITD2", "25OHVITD3", "VD25OH"   Review of Systems  Constitutional:  Negative for fatigue and unexpected weight change.  HENT:  Negative for nosebleeds.   Eyes:  Negative for visual disturbance.  Respiratory:  Negative for cough, chest tightness, shortness of breath and wheezing.   Cardiovascular:  Negative for chest pain, palpitations and leg swelling.  Gastrointestinal:  Negative for abdominal pain, constipation and diarrhea.  Neurological:  Negative for dizziness, weakness, light-headedness and headaches.    Patient Active Problem List   Diagnosis Date Noted   S/P total hysterectomy 08/04/2020   Gastroesophageal reflux disease without esophagitis 05/23/2018   Hyperlipidemia, mild 04/08/2015   Essential  (primary) hypertension 03/04/2015   Local edema 03/04/2015   Abnormal LFTs 03/04/2015    No Known Allergies  Past Surgical History:  Procedure Laterality Date   TOTAL ABDOMINAL HYSTERECTOMY     Right Ovary remains   TUBAL LIGATION      Social History   Tobacco Use   Smoking status: Former    Packs/day: 1.00    Years: 23.00    Total pack years: 23.00    Types: Cigarettes    Start date: 11/21/1977    Quit date: 11/21/2000    Years since quitting: 21.2   Smokeless tobacco: Never  Substance Use Topics   Alcohol use: Yes    Alcohol/week: 2.0 standard drinks of alcohol    Types: 2 Standard drinks or equivalent per week   Drug use: Never     Medication list has been reviewed and updated.  No outpatient medications have been marked as taking for the 02/08/22 encounter (Office Visit) with Glean Hess, MD.       02/08/2022    8:55 AM 08/11/2021    8:33 AM 02/04/2021    9:58 AM 08/05/2020    8:15 AM  GAD 7 : Generalized Anxiety Score  Nervous, Anxious, on Edge 0 0 0 0  Control/stop worrying 0 0 0 0  Worry too much - different things 0 0 0 0  Trouble relaxing 0 0 0 0  Restless 0 0  0 0  Easily annoyed or irritable 0 0 0 0  Afraid - awful might happen 0 0 0 0  Total GAD 7 Score 0 0 0 0  Anxiety Difficulty Not difficult at all   Not difficult at all       02/08/2022    8:55 AM 08/11/2021    8:32 AM 02/04/2021    9:58 AM  Depression screen PHQ 2/9  Decreased Interest 0 0 0  Down, Depressed, Hopeless 0 0 0  PHQ - 2 Score 0 0 0  Altered sleeping 1 0 0  Tired, decreased energy 0 0 0  Change in appetite 0 0 0  Feeling bad or failure about yourself  0 0 0  Trouble concentrating 0 0 0  Moving slowly or fidgety/restless 0 0 0  Suicidal thoughts 0 0 0  PHQ-9 Score 1 0 0  Difficult doing work/chores Not difficult at all      BP Readings from Last 3 Encounters:  02/08/22 128/70  08/11/21 122/86  02/04/21 130/75    Physical Exam Vitals and nursing note reviewed.   Constitutional:      General: She is not in acute distress.    Appearance: She is well-developed.  HENT:     Head: Normocephalic and atraumatic.  Cardiovascular:     Rate and Rhythm: Normal rate and regular rhythm.  Pulmonary:     Effort: Pulmonary effort is normal. No respiratory distress.  Musculoskeletal:     Cervical back: Normal range of motion.     Right lower leg: No edema.     Left lower leg: No edema.  Skin:    General: Skin is warm and dry.     Findings: No rash.  Neurological:     Mental Status: She is alert and oriented to person, place, and time.  Psychiatric:        Mood and Affect: Mood normal.        Behavior: Behavior normal.     Wt Readings from Last 3 Encounters:  02/08/22 186 lb (84.4 kg)  08/11/21 185 lb (83.9 kg)  02/04/21 184 lb (83.5 kg)    BP 128/70   Pulse 82   Ht 5' 5" (1.651 m)   Wt 186 lb (84.4 kg)   BMI 30.95 kg/m   Assessment and Plan: 1. Essential (primary) hypertension Clinically stable exam with well controlled BP. Tolerating medications without side effects at this time. Pt to continue current regimen and low sodium diet; benefits of regular exercise as able discussed. - hydrochlorothiazide (HYDRODIURIL) 25 MG tablet; Take 1 tablet (25 mg total) by mouth daily.  Dispense: 90 tablet; Refill: 0 - losartan (COZAAR) 50 MG tablet; Take 1 tablet (50 mg total) by mouth daily.  Dispense: 90 tablet; Refill: 3  2. Prediabetes Continue to work on limiting carbs and sweets.   Partially dictated using Editor, commissioning. Any errors are unintentional.  Halina Maidens, MD [facility]  02/08/2022

## 2022-08-17 ENCOUNTER — Encounter: Payer: 59 | Admitting: Internal Medicine

## 2022-08-17 ENCOUNTER — Encounter: Payer: Self-pay | Admitting: Internal Medicine

## 2022-09-14 ENCOUNTER — Other Ambulatory Visit: Payer: Self-pay | Admitting: Internal Medicine

## 2022-09-14 DIAGNOSIS — I10 Essential (primary) hypertension: Secondary | ICD-10-CM

## 2022-09-14 NOTE — Telephone Encounter (Signed)
Requested Prescriptions  Pending Prescriptions Disp Refills   hydrochlorothiazide (HYDRODIURIL) 25 MG tablet [Pharmacy Med Name: HYDROCHLOROTHIAZIDE 25 MG TAB] 90 tablet 0    Sig: TAKE 1 TABLET (25 MG TOTAL) BY MOUTH DAILY.     Cardiovascular: Diuretics - Thiazide Failed - 09/14/2022  2:10 AM      Failed - Cr in normal range and within 180 days    Creatinine, Ser  Date Value Ref Range Status  08/11/2021 0.68 0.57 - 1.00 mg/dL Final         Failed - K in normal range and within 180 days    Potassium  Date Value Ref Range Status  08/11/2021 4.1 3.5 - 5.2 mmol/L Final         Failed - Na in normal range and within 180 days    Sodium  Date Value Ref Range Status  08/11/2021 136 134 - 144 mmol/L Final         Failed - Valid encounter within last 6 months    Recent Outpatient Visits           7 months ago Essential (primary) hypertension   [facility] [place], Jesse Sans, MD   1 year ago Annual physical exam   [facility] [place], Jesse Sans, MD   1 year ago Essential (primary) hypertension    [place] [facility] [place], Jesse Sans, MD   2 years ago Annual physical exam   [place] [facility], Jesse Sans, MD   3 years ago Annual physical exam   [place] at [facility], Jesse Sans, MD       Future Appointments             In 3 months [facility] Melia Jesse Sans, MD [place] at [facility], [place] BP in normal range    BP Readings from Last 1 Encounters:  02/08/22 128/70

## 2022-12-11 ENCOUNTER — Other Ambulatory Visit: Payer: Self-pay | Admitting: Internal Medicine

## 2022-12-11 DIAGNOSIS — I10 Essential (primary) hypertension: Secondary | ICD-10-CM

## 2022-12-15 ENCOUNTER — Encounter: Payer: Self-pay | Admitting: Internal Medicine

## 2022-12-15 ENCOUNTER — Ambulatory Visit (INDEPENDENT_AMBULATORY_CARE_PROVIDER_SITE_OTHER): Payer: 59 | Admitting: Internal Medicine

## 2022-12-15 VITALS — BP 128/78 | HR 97 | Ht 65.0 in | Wt 186.0 lb

## 2022-12-15 DIAGNOSIS — K219 Gastro-esophageal reflux disease without esophagitis: Secondary | ICD-10-CM

## 2022-12-15 DIAGNOSIS — Z0000 Encounter for general adult medical examination without abnormal findings: Secondary | ICD-10-CM

## 2022-12-15 DIAGNOSIS — Z1231 Encounter for screening mammogram for malignant neoplasm of breast: Secondary | ICD-10-CM

## 2022-12-15 DIAGNOSIS — Z131 Encounter for screening for diabetes mellitus: Secondary | ICD-10-CM

## 2022-12-15 DIAGNOSIS — F17201 Nicotine dependence, unspecified, in remission: Secondary | ICD-10-CM

## 2022-12-15 DIAGNOSIS — Z1211 Encounter for screening for malignant neoplasm of colon: Secondary | ICD-10-CM

## 2022-12-15 DIAGNOSIS — E785 Hyperlipidemia, unspecified: Secondary | ICD-10-CM | POA: Diagnosis not present

## 2022-12-15 DIAGNOSIS — I10 Essential (primary) hypertension: Secondary | ICD-10-CM

## 2022-12-15 LAB — POCT URINALYSIS DIPSTICK
Bilirubin, UA: NEGATIVE
Blood, UA: NEGATIVE
Glucose, UA: NEGATIVE
Ketones, UA: NEGATIVE
Leukocytes, UA: NEGATIVE
Nitrite, UA: NEGATIVE
Protein, UA: NEGATIVE
Spec Grav, UA: 1.015 (ref 1.010–1.025)
Urobilinogen, UA: 0.2 E.U./dL
pH, UA: 6 (ref 5.0–8.0)

## 2022-12-15 NOTE — Assessment & Plan Note (Addendum)
Reflux symptoms are minimal on current therapy - esomeprazole prn. No red flag signs such as weight loss, n/v, melena

## 2022-12-15 NOTE — Assessment & Plan Note (Signed)
Stable exam with well controlled BP.  Currently taking losartan and hctz. Tolerating medications without concerns or side effects. Will continue to recommend low sodium diet and current regimen.  

## 2022-12-15 NOTE — Assessment & Plan Note (Signed)
Lipids managed with diet and exercise. Lab Results  Component Value Date   LDLCALC 131 (H) 08/11/2021

## 2022-12-15 NOTE — Assessment & Plan Note (Signed)
She is not interested in LDCT screening at this time.

## 2022-12-15 NOTE — Progress Notes (Signed)
Date:  12/15/2022   Name:  Sherri Cox   DOB:  [birth year]   MRN:  [id number]   Chief Complaint: Annual Exam Sherri Cox is a 62 y.o. female who presents today for her Complete Annual Exam. She feels well. She reports exercising. She reports she is sleeping poorly. Breast complaints - none.  Mammogram: 04/2022  [facility] scheduled DEXA: none Pap smear: discontinued Colonoscopy: 2021 Cologuard repeat due.  Health Maintenance Due  Topic Date Due   HIV Screening  Never done   DTaP/Tdap/Td (1 - Tdap) Never done   COVID-19 Vaccine (7 - 2023-24 season) 05/23/2022    Immunization History  Administered Date(s) Administered   Influenza Inj Mdck Quad Pf 03/07/2022   Influenza,inj,Quad PF,6+ Mos 02/11/2019, 03/16/2020, 04/05/2021   Moderna Covid-19 Vaccine Bivalent Booster 62yrs & up 06/22/2021   Moderna Sars-Covid-2 Vaccination 09/17/2019, 10/15/2019, 05/18/2020, 10/26/2020   PFIZER Comirnaty(Gray Top)Covid-19 Tri-Sucrose Vaccine 03/28/2022   Zoster Recombinat (Shingrix) 02/11/2019, 05/20/2019    Hypertension This is a chronic problem. The problem is controlled. Pertinent negatives include no chest pain, headaches, palpitations or shortness of breath. Past treatments include angiotensin blockers and diuretics.    Lab Results  Component Value Date   NA 136 08/11/2021   K 4.1 08/11/2021   CO2 24 08/11/2021   GLUCOSE 103 (H) 08/11/2021   BUN 11 08/11/2021   CREATININE 0.68 08/11/2021   CALCIUM 9.7 08/11/2021   EGFR 99 08/11/2021   GFRNONAA 93 08/05/2020   Lab Results  Component Value Date   CHOL 261 (H) 08/11/2021   HDL 107 08/11/2021   LDLCALC 131 (H) 08/11/2021   TRIG 135 08/11/2021   CHOLHDL 2.4 08/11/2021   Lab Results  Component Value Date   TSH 2.000 08/11/2021   Lab Results  Component Value Date   HGBA1C 5.7 (H) 08/11/2021   Lab Results  Component Value Date   WBC 6.0 08/11/2021   HGB 14.6 08/11/2021   HCT 42.9 08/11/2021   MCV 93 08/11/2021    PLT 242 08/11/2021   Lab Results  Component Value Date   ALT 65 (H) 08/11/2021   AST 62 (H) 08/11/2021   ALKPHOS 68 08/11/2021   BILITOT 0.9 08/11/2021   No results found for: "25OHVITD2", "25OHVITD3", "VD25OH"   Review of Systems  Constitutional:  Negative for chills, fatigue and fever.  HENT:  Negative for congestion, hearing loss, tinnitus, trouble swallowing and voice change.   Eyes:  Negative for visual disturbance.  Respiratory:  Negative for cough, chest tightness, shortness of breath and wheezing.   Cardiovascular:  Negative for chest pain, palpitations and leg swelling.  Gastrointestinal:  Negative for abdominal pain, constipation, diarrhea and vomiting.  Endocrine: Negative for polydipsia and polyuria.  Genitourinary:  Negative for dysuria, frequency, genital sores, vaginal bleeding and vaginal discharge.  Musculoskeletal:  Negative for arthralgias, gait problem and joint swelling.  Skin:  Negative for color change and rash.  Neurological:  Negative for dizziness, tremors, light-headedness and headaches.  Hematological:  Negative for adenopathy. Does not bruise/bleed easily.  Psychiatric/Behavioral:  Negative for dysphoric mood and sleep disturbance. The patient is not nervous/anxious.     Patient Active Problem List   Diagnosis Date Noted   Tobacco use disorder, moderate, in sustained remission, dependence 12/15/2022   S/P total hysterectomy 08/04/2020   Gastroesophageal reflux disease without esophagitis 05/23/2018   Hyperlipidemia, mild 04/08/2015   Essential (primary) hypertension 03/04/2015   Local edema 03/04/2015   Abnormal LFTs 03/04/2015  No Known Allergies  Past Surgical History:  Procedure Laterality Date   TOTAL ABDOMINAL HYSTERECTOMY     Right Ovary remains   TUBAL LIGATION      Social History   Tobacco Use   Smoking status: Former    Packs/day: 1.00    Years: 23.00    Additional pack years: 0.00    Total pack years: 23.00    Types:  Cigarettes    Start date: 11/21/1977    Quit date: 11/21/2000    Years since quitting: 22.0   Smokeless tobacco: Never  Substance Use Topics   Alcohol use: Yes    Alcohol/week: 2.0 standard drinks of alcohol    Types: 2 Standard drinks or equivalent per week   Drug use: Never     Medication list has been reviewed and updated.  Current Meds  Medication Sig   esomeprazole (NEXIUM) 20 MG capsule Take 1 capsule by mouth as needed.    hydrochlorothiazide (HYDRODIURIL) 25 MG tablet TAKE 1 TABLET (25 MG TOTAL) BY MOUTH DAILY.   losartan (COZAAR) 50 MG tablet Take 1 tablet (50 mg total) by mouth daily.       12/15/2022   10:35 AM 02/08/2022    8:55 AM 08/11/2021    8:33 AM 02/04/2021    9:58 AM  GAD 7 : Generalized Anxiety Score  Nervous, Anxious, on Edge 0 0 0 0  Control/stop worrying 0 0 0 0  Worry too much - different things 0 0 0 0  Trouble relaxing 0 0 0 0  Restless 0 0 0 0  Easily annoyed or irritable 0 0 0 0  Afraid - awful might happen 0 0 0 0  Total GAD 7 Score 0 0 0 0  Anxiety Difficulty Not difficult at all Not difficult at all         12/15/2022   10:35 AM 02/08/2022    8:55 AM 08/11/2021    8:32 AM  Depression screen PHQ 2/9  Decreased Interest 0 0 0  Down, Depressed, Hopeless 0 0 0  PHQ - 2 Score 0 0 0  Altered sleeping 1 1 0  Tired, decreased energy 0 0 0  Change in appetite 0 0 0  Feeling bad or failure about yourself  0 0 0  Trouble concentrating 0 0 0  Moving slowly or fidgety/restless 0 0 0  Suicidal thoughts 0 0 0  PHQ-9 Score 1 1 0  Difficult doing work/chores Not difficult at all Not difficult at all     BP Readings from Last 3 Encounters:  12/15/22 128/78  02/08/22 128/70  08/11/21 122/86    Physical Exam Vitals and nursing note reviewed.  Constitutional:      General: She is not in acute distress.    Appearance: She is well-developed.  HENT:     Head: Normocephalic and atraumatic.     Right Ear: Tympanic membrane and ear canal normal.      Left Ear: Tympanic membrane and ear canal normal.     Nose:     Right Sinus: No maxillary sinus tenderness.     Left Sinus: No maxillary sinus tenderness.  Eyes:     General: No scleral icterus.       Right eye: No discharge.        Left eye: No discharge.     Conjunctiva/sclera: Conjunctivae normal.  Neck:     Thyroid: No thyromegaly.     Vascular: No carotid bruit.  Cardiovascular:  Rate and Rhythm: Normal rate and regular rhythm.     Pulses: Normal pulses.     Heart sounds: Normal heart sounds.  Pulmonary:     Effort: Pulmonary effort is normal. No respiratory distress.     Breath sounds: No wheezing.  Chest:  Breasts:    Right: No mass, nipple discharge, skin change or tenderness.     Left: No mass, nipple discharge, skin change or tenderness.  Abdominal:     General: Bowel sounds are normal.     Palpations: Abdomen is soft.     Tenderness: There is no abdominal tenderness.  Musculoskeletal:     Cervical back: Normal range of motion. No erythema.     Right lower leg: No edema.     Left lower leg: No edema.  Lymphadenopathy:     Cervical: No cervical adenopathy.  Skin:    General: Skin is warm and dry.     Capillary Refill: Capillary refill takes less than 2 seconds.     Findings: No rash.  Neurological:     General: No focal deficit present.     Mental Status: She is alert and oriented to person, place, and time.     Cranial Nerves: No cranial nerve deficit.     Sensory: No sensory deficit.     Deep Tendon Reflexes: Reflexes are normal and symmetric.  Psychiatric:        Attention and Perception: Attention normal.        Mood and Affect: Mood normal.     Wt Readings from Last 3 Encounters:  12/15/22 186 lb (84.4 kg)  02/08/22 186 lb (84.4 kg)  08/11/21 185 lb (83.9 kg)    BP 128/78   Pulse 97   Ht 5\' 5"  (1.651 m)   Wt 186 lb (84.4 kg)   SpO2 95%   BMI 30.95 kg/m   Assessment and Plan:  Problem List Items Addressed This Visit     Tobacco use  disorder, moderate, in sustained remission, dependence    She is not interested in LDCT screening at this time.      Hyperlipidemia, mild (Chronic)    Lipids managed with diet and exercise. Lab Results  Component Value Date   LDLCALC 131 (H) 08/11/2021        Relevant Orders   Lipid panel   Gastroesophageal reflux disease without esophagitis (Chronic)    Reflux symptoms are minimal on current therapy - esomeprazole prn. No red flag signs such as weight loss, n/v, melena       Relevant Orders   CBC with Differential/Platelet   Essential (primary) hypertension (Chronic)    Stable exam with well controlled BP.  Currently taking losartan and hctz. Tolerating medications without concerns or side effects. Will continue to recommend low sodium diet and current regimen.       Relevant Orders   Comprehensive metabolic panel   POCT urinalysis dipstick   Other Visit Diagnoses     Annual physical exam    -  Primary   continue healthy diet and exercise  up to date on screenings and immunizations.   Relevant Orders   CBC with Differential/Platelet   Comprehensive metabolic panel   Lipid panel   TSH   Hemoglobin A1c   Encounter for screening mammogram for breast cancer       schedule at [facility]   Screening for diabetes mellitus       Relevant Orders   TSH   Colon cancer screening  Relevant Orders   Cologuard       Return in about 6 months (around 06/16/2023) for HTN.   Partially dictated using Dragon software, any errors are not intentional.  Reubin Milan, MD [facility] [place], [place]

## 2022-12-16 LAB — HEMOGLOBIN A1C
Est. average glucose Bld gHb Est-mCnc: 114 mg/dL
Hgb A1c MFr Bld: 5.6 % (ref 4.8–5.6)

## 2022-12-16 LAB — CBC WITH DIFFERENTIAL/PLATELET
Basophils Absolute: 0 10*3/uL (ref 0.0–0.2)
Basos: 1 %
EOS (ABSOLUTE): 0.2 10*3/uL (ref 0.0–0.4)
Eos: 3 %
Hematocrit: 40 % (ref 34.0–46.6)
Hemoglobin: 14 g/dL (ref 11.1–15.9)
Immature Grans (Abs): 0 10*3/uL (ref 0.0–0.1)
Immature Granulocytes: 0 %
Lymphocytes Absolute: 2.1 10*3/uL (ref 0.7–3.1)
Lymphs: 38 %
MCH: 32 pg (ref 26.6–33.0)
MCHC: 35 g/dL (ref 31.5–35.7)
MCV: 91 fL (ref 79–97)
Monocytes Absolute: 0.4 10*3/uL (ref 0.1–0.9)
Monocytes: 8 %
Neutrophils Absolute: 2.8 10*3/uL (ref 1.4–7.0)
Neutrophils: 50 %
Platelets: 235 10*3/uL (ref 150–450)
RBC: 4.38 x10E6/uL (ref 3.77–5.28)
RDW: 11.5 % — ABNORMAL LOW (ref 11.7–15.4)
WBC: 5.6 10*3/uL (ref 3.4–10.8)

## 2022-12-16 LAB — LIPID PANEL
Chol/HDL Ratio: 2.3 ratio (ref 0.0–4.4)
Cholesterol, Total: 231 mg/dL — ABNORMAL HIGH (ref 100–199)
HDL: 100 mg/dL (ref >39)
LDL Chol Calc (NIH): 110 mg/dL — ABNORMAL HIGH (ref 0–99)
Triglycerides: 123 mg/dL (ref 0–149)
VLDL Cholesterol Cal: 21 mg/dL (ref 5–40)

## 2022-12-16 LAB — COMPREHENSIVE METABOLIC PANEL
ALT: 57 IU/L — ABNORMAL HIGH (ref 0–32)
AST: 60 IU/L — ABNORMAL HIGH (ref 0–40)
Albumin: 4.5 g/dL (ref 3.9–4.9)
Alkaline Phosphatase: 59 IU/L (ref 44–121)
BUN/Creatinine Ratio: 10 — ABNORMAL LOW (ref 12–28)
BUN: 7 mg/dL — ABNORMAL LOW (ref 8–27)
Bilirubin Total: 0.8 mg/dL (ref 0.0–1.2)
CO2: 22 mmol/L (ref 20–29)
Calcium: 9.2 mg/dL (ref 8.7–10.3)
Chloride: 101 mmol/L (ref 96–106)
Creatinine, Ser: 0.68 mg/dL (ref 0.57–1.00)
Globulin, Total: 3.1 g/dL (ref 1.5–4.5)
Glucose: 106 mg/dL — ABNORMAL HIGH (ref 70–99)
Potassium: 4.4 mmol/L (ref 3.5–5.2)
Sodium: 139 mmol/L (ref 134–144)
Total Protein: 7.6 g/dL (ref 6.0–8.5)
eGFR: 98 mL/min/{1.73_m2} (ref >59)

## 2022-12-16 LAB — TSH: TSH: 1.5 u[IU]/mL (ref 0.450–4.500)

## 2023-01-02 LAB — COLOGUARD: COLOGUARD: NEGATIVE

## 2023-03-08 ENCOUNTER — Other Ambulatory Visit: Payer: Self-pay | Admitting: Internal Medicine

## 2023-03-08 DIAGNOSIS — I10 Essential (primary) hypertension: Secondary | ICD-10-CM

## 2023-03-14 ENCOUNTER — Encounter: Payer: Self-pay | Admitting: Internal Medicine

## 2023-05-31 ENCOUNTER — Ambulatory Visit: Payer: 59 | Admitting: Internal Medicine

## 2023-06-27 ENCOUNTER — Encounter: Payer: Self-pay | Admitting: Internal Medicine

## 2023-06-27 ENCOUNTER — Ambulatory Visit: Payer: 59 | Admitting: Internal Medicine

## 2023-06-27 VITALS — BP 128/80 | HR 99 | Ht 65.0 in | Wt 188.6 lb

## 2023-06-27 DIAGNOSIS — M25572 Pain in left ankle and joints of left foot: Secondary | ICD-10-CM | POA: Diagnosis not present

## 2023-06-27 DIAGNOSIS — I10 Essential (primary) hypertension: Secondary | ICD-10-CM | POA: Diagnosis not present

## 2023-06-27 DIAGNOSIS — M21619 Bunion of unspecified foot: Secondary | ICD-10-CM

## 2023-06-27 DIAGNOSIS — G8929 Other chronic pain: Secondary | ICD-10-CM | POA: Diagnosis not present

## 2023-06-27 NOTE — Assessment & Plan Note (Addendum)
 Controlled BP with normal exam. Current regimen is losartan and hydrochlorothiazide. will continue same medications; encourage continued reduced sodium diet

## 2023-06-27 NOTE — Progress Notes (Signed)
 Date:  06/27/2023   Name:  Sherri Cox   DOB:  [birth year]   MRN:  [id number]   Chief Complaint: Hypertension  Hypertension This is a chronic problem. The problem is controlled. Pertinent negatives include no chest pain, headaches, palpitations or shortness of breath. Past treatments include angiotensin blockers and diuretics. The current treatment provides significant improvement. There is no history of kidney disease, CAD/MI or CVA.  Foot Injury  There was no injury mechanism. The pain is present in the right foot and left foot. The quality of the pain is described as aching. The pain is mild. The pain has been Fluctuating since onset. Associated symptoms comments: Due to bunions .  Ankle Pain  There was no injury mechanism. The pain is present in the left ankle. The quality of the pain is described as aching. The pain is moderate. The pain has been Fluctuating since onset. The symptoms are aggravated by weight bearing. She has tried NSAIDs for the symptoms. The treatment provided mild relief.    Review of Systems  Constitutional:  Negative for fatigue and unexpected weight change.  HENT:  Negative for nosebleeds.   Eyes:  Negative for visual disturbance.  Respiratory:  Negative for cough, chest tightness, shortness of breath and wheezing.   Cardiovascular:  Negative for chest pain, palpitations and leg swelling.  Gastrointestinal:  Negative for abdominal pain, constipation and diarrhea.  Musculoskeletal:  Positive for arthralgias and joint swelling.  Neurological:  Negative for dizziness, weakness, light-headedness and headaches.  Psychiatric/Behavioral:  Negative for dysphoric mood and sleep disturbance. The patient is not nervous/anxious.      Lab Results  Component Value Date   NA 139 12/15/2022   K 4.4 12/15/2022   CO2 22 12/15/2022   GLUCOSE 106 (H) 12/15/2022   BUN 7 (L) 12/15/2022   CREATININE 0.68 12/15/2022   CALCIUM 9.2 12/15/2022   EGFR 98 12/15/2022   GFRNONAA  93 08/05/2020   Lab Results  Component Value Date   CHOL 231 (H) 12/15/2022   HDL 100 12/15/2022   LDLCALC 110 (H) 12/15/2022   TRIG 123 12/15/2022   CHOLHDL 2.3 12/15/2022   Lab Results  Component Value Date   TSH 1.500 12/15/2022   Lab Results  Component Value Date   HGBA1C 5.6 12/15/2022   Lab Results  Component Value Date   WBC 5.6 12/15/2022   HGB 14.0 12/15/2022   HCT 40.0 12/15/2022   MCV 91 12/15/2022   PLT 235 12/15/2022   Lab Results  Component Value Date   ALT 57 (H) 12/15/2022   AST 60 (H) 12/15/2022   ALKPHOS 59 12/15/2022   BILITOT 0.8 12/15/2022   No results found for: MARIEN BOLLS, VD25OH   Patient Active Problem List   Diagnosis Date Noted   Tobacco use disorder, moderate, in sustained remission, dependence 12/15/2022   S/P total hysterectomy 08/04/2020   Gastroesophageal reflux disease without esophagitis 05/23/2018   Hyperlipidemia, mild 04/08/2015   Essential (primary) hypertension 03/04/2015   Local edema 03/04/2015   Abnormal LFTs 03/04/2015    No Known Allergies  Past Surgical History:  Procedure Laterality Date   TOTAL ABDOMINAL HYSTERECTOMY     Right Ovary remains   TUBAL LIGATION      Social History   Tobacco Use   Smoking status: Former    Current packs/day: 0.00    Average packs/day: 1 pack/day for 23.0 years (23.0 ttl pk-yrs)    Types: Cigarettes    Start date: 11/21/1977  Quit date: 11/21/2000    Years since quitting: 22.6   Smokeless tobacco: Never  Substance Use Topics   Alcohol use: Yes    Alcohol/week: 2.0 standard drinks of alcohol    Types: 2 Standard drinks or equivalent per week   Drug use: Never     Medication list has been reviewed and updated.  Current Meds  Medication Sig   esomeprazole (NEXIUM) 20 MG capsule Take 1 capsule by mouth as needed.    hydrochlorothiazide  (HYDRODIURIL ) 25 MG tablet TAKE 1 TABLET (25 MG TOTAL) BY MOUTH DAILY.   losartan  (COZAAR ) 50 MG tablet TAKE 1 TABLET BY  MOUTH EVERY DAY       06/27/2023    2:54 PM 12/15/2022   10:35 AM 02/08/2022    8:55 AM 08/11/2021    8:33 AM  GAD 7 : Generalized Anxiety Score  Nervous, Anxious, on Edge 0 0 0 0  Control/stop worrying 0 0 0 0  Worry too much - different things 0 0 0 0  Trouble relaxing 0 0 0 0  Restless 0 0 0 0  Easily annoyed or irritable 0 0 0 0  Afraid - awful might happen 0 0 0 0  Total GAD 7 Score 0 0 0 0  Anxiety Difficulty Not difficult at all Not difficult at all Not difficult at all        06/27/2023    2:54 PM 12/15/2022   10:35 AM 02/08/2022    8:55 AM  Depression screen PHQ 2/9  Decreased Interest 0 0 0  Down, Depressed, Hopeless 0 0 0  PHQ - 2 Score 0 0 0  Altered sleeping 0 1 1  Tired, decreased energy 0 0 0  Change in appetite 0 0 0  Feeling bad or failure about yourself  0 0 0  Trouble concentrating 0 0 0  Moving slowly or fidgety/restless 0 0 0  Suicidal thoughts 0 0 0  PHQ-9 Score 0 1 1  Difficult doing work/chores Not difficult at all Not difficult at all Not difficult at all    BP Readings from Last 3 Encounters:  06/27/23 128/80  12/15/22 128/78  02/08/22 128/70    Physical Exam Vitals and nursing note reviewed.  Constitutional:      General: She is not in acute distress.    Appearance: She is well-developed.  HENT:     Head: Normocephalic and atraumatic.  Neck:     Vascular: No carotid bruit.  Cardiovascular:     Rate and Rhythm: Normal rate and regular rhythm.  Pulmonary:     Effort: Pulmonary effort is normal. No respiratory distress.     Breath sounds: No wheezing or rhonchi.  Musculoskeletal:     Cervical back: Normal range of motion.     Right lower leg: No edema.     Left lower leg: No edema.     Right ankle: No swelling. No tenderness. Normal range of motion.     Left ankle: Swelling present. No tenderness. Decreased range of motion.     Comments: Bilateral bunions  Lymphadenopathy:     Cervical: No cervical adenopathy.  Skin:    General:  Skin is warm and dry.     Findings: No rash.  Neurological:     Mental Status: She is alert and oriented to person, place, and time.  Psychiatric:        Mood and Affect: Mood normal.        Behavior: Behavior normal.  Wt Readings from Last 3 Encounters:  06/27/23 188 lb 9.6 oz (85.5 kg)  12/15/22 186 lb (84.4 kg)  02/08/22 186 lb (84.4 kg)    BP 128/80   Pulse 99   Ht 5' 5 (1.651 m)   Wt 188 lb 9.6 oz (85.5 kg)   SpO2 100%   BMI 31.38 kg/m   Assessment and Plan:  Problem List Items Addressed This Visit     Essential (primary) hypertension - Primary (Chronic)   Controlled BP with normal exam. Current regimen is losartan  and hydrochlorothiazide . will continue same medications; encourage continued reduced sodium diet      Other Visit Diagnoses       Chronic pain of left ankle       continue Advil 400 mg tid PRN   Relevant Orders   Ambulatory referral to Podiatry     Bunion       Relevant Orders   Ambulatory referral to Podiatry        No follow-ups on file.    Leita HILARIO Adie, MD [facility] [place]

## 2023-06-27 NOTE — Assessment & Plan Note (Deleted)
Reflux symptoms are minimal on current therapy - esomeprazole prn. No red flag signs such as weight loss, n/v, melena

## 2023-07-18 ENCOUNTER — Encounter: Payer: Self-pay | Admitting: Podiatry

## 2023-07-18 ENCOUNTER — Ambulatory Visit (INDEPENDENT_AMBULATORY_CARE_PROVIDER_SITE_OTHER): Payer: 59

## 2023-07-18 ENCOUNTER — Ambulatory Visit: Payer: 59 | Admitting: Podiatry

## 2023-07-18 DIAGNOSIS — M2012 Hallux valgus (acquired), left foot: Secondary | ICD-10-CM

## 2023-07-18 DIAGNOSIS — M76822 Posterior tibial tendinitis, left leg: Secondary | ICD-10-CM | POA: Diagnosis not present

## 2023-07-18 DIAGNOSIS — M779 Enthesopathy, unspecified: Secondary | ICD-10-CM

## 2023-07-18 DIAGNOSIS — M2011 Hallux valgus (acquired), right foot: Secondary | ICD-10-CM | POA: Diagnosis not present

## 2023-07-18 NOTE — Progress Notes (Signed)
   Chief Complaint  Patient presents with   Foot Pain    "I have pain in my ankle and my bunions." N - ankle pain L - left medial ankle D - right after Christmas O - suddenly, about the same, comes and goes C - sharp pain A - walk a lot T - epsom salt soaks, biofreeze  N - bunions L - 5th metatarsal bilateral D - 3-4 years O - slowly worse C - getting bigger A - certain shoes T - find wider shoes    HPI: 63 y.o. female presenting today as a new patient for evaluation of pain and tenderness to the left ankle ongoing for the last 3-4 weeks.  Patient states that after Christmas she began exercising and walking on a treadmill and began to experience increased pain.  Alleviated with rest.  No past medical history on file.  Past Surgical History:  Procedure Laterality Date   TOTAL ABDOMINAL HYSTERECTOMY     Right Ovary remains   TUBAL LIGATION      No Known Allergies   Physical Exam: General: The patient is alert and oriented x3 in no acute distress.  Dermatology: Skin is warm, dry and supple bilateral lower extremities.   Vascular: Palpable pedal pulses bilaterally. Capillary refill within normal limits.  No appreciable edema.  No erythema.  Neurological: Grossly intact via light touch  Musculoskeletal Exam: Bearing there is collapse of the medial longitudinal arch of the foot and valgus deformity of the heel.  Associated tenderness with palpation along the medial aspect of the left ankle  Radiographic Exam B/L feet and LT ankle 07/18/2023:  Metatarsal splay noted bilateral with subluxation of the lesser MTPs.  Pes planovalgus deformity also noted to the left foot more so than the right  Assessment/Plan of Care: 1. PTTD w/ pes planovalgus deformity LT 2.  Metatarsal splay bilateral  -Patient evaluated.  X-rays reviewed -Today we discussed different conservative modalities most importantly arch supports to support the medial longitudinal arch of the foot.  Discussed  custom and OTC prefabricated insoles.  For now we will pursue prefabricated insoles to see if this helps alleviate the patient's symptoms -Recommend [employer] running store here in [place] -Compression ankle sleeve dispensed.  Wear daily -Return to clinic as needed       Felecia Shelling, DPM [facility]  Dr. Felecia Shelling, DPM    [address] [place], [place] [address]                Office [phone]  Fax [phone]

## 2023-11-29 ENCOUNTER — Other Ambulatory Visit: Payer: Self-pay | Admitting: Internal Medicine

## 2023-11-29 DIAGNOSIS — I10 Essential (primary) hypertension: Secondary | ICD-10-CM

## 2023-11-30 NOTE — Telephone Encounter (Signed)
 Requested Prescriptions  Pending Prescriptions Disp Refills   hydrochlorothiazide  (HYDRODIURIL ) 25 MG tablet [Pharmacy Med Name: HYDROCHLOROTHIAZIDE  25 MG TAB] 90 tablet 0    Sig: TAKE 1 TABLET (25 MG TOTAL) BY MOUTH DAILY.     Cardiovascular: Diuretics - Thiazide Failed - 11/30/2023 12:59 PM      Failed - Cr in normal range and within 180 days    Creatinine, Ser  Date Value Ref Range Status  12/15/2022 0.68 0.57 - 1.00 mg/dL Final         Failed - K in normal range and within 180 days    Potassium  Date Value Ref Range Status  12/15/2022 4.4 3.5 - 5.2 mmol/L Final         Failed - Na in normal range and within 180 days    Sodium  Date Value Ref Range Status  12/15/2022 139 134 - 144 mmol/L Final         Failed - Valid encounter within last 6 months    Recent Outpatient Visits   None     Future Appointments             In 4 weeks Sheron Dixons, MD [facility] at [facility]            Passed - Last BP in normal range    BP Readings from Last 1 Encounters:  06/27/23 128/80

## 2023-12-19 ENCOUNTER — Encounter: Payer: Self-pay | Admitting: Internal Medicine

## 2023-12-28 ENCOUNTER — Ambulatory Visit (INDEPENDENT_AMBULATORY_CARE_PROVIDER_SITE_OTHER): Payer: Self-pay | Admitting: Internal Medicine

## 2023-12-28 ENCOUNTER — Encounter: Payer: Self-pay | Admitting: Internal Medicine

## 2023-12-28 VITALS — BP 122/78 | HR 96 | Ht 65.0 in | Wt 186.0 lb

## 2023-12-28 DIAGNOSIS — Z131 Encounter for screening for diabetes mellitus: Secondary | ICD-10-CM | POA: Diagnosis not present

## 2023-12-28 DIAGNOSIS — E785 Hyperlipidemia, unspecified: Secondary | ICD-10-CM

## 2023-12-28 DIAGNOSIS — Z0000 Encounter for general adult medical examination without abnormal findings: Secondary | ICD-10-CM | POA: Diagnosis not present

## 2023-12-28 DIAGNOSIS — R600 Localized edema: Secondary | ICD-10-CM

## 2023-12-28 DIAGNOSIS — I10 Essential (primary) hypertension: Secondary | ICD-10-CM

## 2023-12-28 DIAGNOSIS — Z1231 Encounter for screening mammogram for malignant neoplasm of breast: Secondary | ICD-10-CM

## 2023-12-28 DIAGNOSIS — K219 Gastro-esophageal reflux disease without esophagitis: Secondary | ICD-10-CM | POA: Diagnosis not present

## 2023-12-28 MED ORDER — HYDROCHLOROTHIAZIDE 25 MG PO TABS: 25.0000 mg | ORAL_TABLET | Freq: Every day | ORAL | 3 refills | Status: AC

## 2023-12-28 MED ORDER — LOSARTAN POTASSIUM 50 MG PO TABS: 50.0000 mg | ORAL_TABLET | Freq: Every day | ORAL | 3 refills | Status: AC

## 2023-12-28 NOTE — Assessment & Plan Note (Signed)
 Blood pressure is well controlled.  Current medications losartan  and hydrochlorothiazide . Will continue same regimen along with efforts to limit dietary sodium.

## 2023-12-28 NOTE — Progress Notes (Signed)
 Date:  12/28/2023   Name:  Sherri Cox   DOB:  Feb 01, 1961   MRN:  [id number]   Chief Complaint: Annual Exam Sherri Cox is a 63 y.o. female who presents today for her Complete Annual Exam. She feels well. She reports exercising. She reports she is sleeping well. Breast complaints - none.  Health Maintenance  Topic Date Due   HIV Screening  Never done   DTaP/Tdap/Td vaccine (1 - Tdap) Never done   Flu Shot  01/19/2024   Mammogram  05/04/2024   Cologuard (Stool DNA test)  12/27/2025   COVID-19 Vaccine  Completed   Zoster (Shingles) Vaccine  Completed   Hepatitis C Screening  Addressed   Hepatitis B Vaccine  Aged Out   HPV Vaccine  Aged Out   Meningitis B Vaccine  Aged Out   Colon Cancer Screening  Discontinued    Hypertension This is a chronic problem. The problem is controlled. Pertinent negatives include no chest pain, headaches, palpitations or shortness of breath. Past treatments include angiotensin blockers and diuretics. The current treatment provides significant improvement. There is no history of kidney disease, CAD/MI or CVA.  Gastroesophageal Reflux She complains of heartburn. She reports no abdominal pain, no chest pain, no coughing or no wheezing. This is a recurrent problem. The problem occurs occasionally. Pertinent negatives include no fatigue. She has tried a PPI for the symptoms.  Hyperlipidemia This is a chronic problem. Recent lipid tests were reviewed and are high. Pertinent negatives include no chest pain, myalgias or shortness of breath. Current antihyperlipidemic treatment includes diet change.    Review of Systems  Constitutional:  Negative for fatigue and unexpected weight change.  HENT:  Negative for trouble swallowing.   Eyes:  Negative for visual disturbance.  Respiratory:  Negative for cough, chest tightness, shortness of breath and wheezing.   Cardiovascular:  Negative for chest pain, palpitations and leg swelling.  Gastrointestinal:  Positive  for heartburn. Negative for abdominal pain, constipation and diarrhea.  Musculoskeletal:  Negative for arthralgias and myalgias.  Neurological:  Negative for dizziness, weakness, light-headedness and headaches.     Lab Results  Component Value Date   NA 139 12/15/2022   K 4.4 12/15/2022   CO2 22 12/15/2022   GLUCOSE 106 (H) 12/15/2022   BUN 7 (L) 12/15/2022   CREATININE 0.68 12/15/2022   CALCIUM 9.2 12/15/2022   EGFR 98 12/15/2022   GFRNONAA 93 08/05/2020   Lab Results  Component Value Date   CHOL 231 (H) 12/15/2022   HDL 100 12/15/2022   LDLCALC 110 (H) 12/15/2022   TRIG 123 12/15/2022   CHOLHDL 2.3 12/15/2022   Lab Results  Component Value Date   TSH 1.500 12/15/2022   Lab Results  Component Value Date   HGBA1C 5.6 12/15/2022   Lab Results  Component Value Date   WBC 5.6 12/15/2022   HGB 14.0 12/15/2022   HCT 40.0 12/15/2022   MCV 91 12/15/2022   PLT 235 12/15/2022   Lab Results  Component Value Date   ALT 57 (H) 12/15/2022   AST 60 (H) 12/15/2022   ALKPHOS 59 12/15/2022   BILITOT 0.8 12/15/2022   No results found for: MARIEN BOLLS, VD25OH   Patient Active Problem List   Diagnosis Date Noted   Tobacco use disorder, moderate, in sustained remission, dependence 12/15/2022   S/P total hysterectomy 08/04/2020   Gastroesophageal reflux disease without esophagitis 05/23/2018   Hyperlipidemia, mild 04/08/2015   Essential (primary) hypertension 03/04/2015  Local edema 03/04/2015   Abnormal LFTs 03/04/2015    No Known Allergies  Past Surgical History:  Procedure Laterality Date   TOTAL ABDOMINAL HYSTERECTOMY     Right Ovary remains   TUBAL LIGATION      Social History   Tobacco Use   Smoking status: Former    Current packs/day: 0.00    Average packs/day: 1 pack/day for 23.0 years (23.0 ttl pk-yrs)    Types: Cigarettes    Start date: 11/21/1977    Quit date: 11/21/2000    Years since quitting: 23.1   Smokeless tobacco: Never   Substance Use Topics   Alcohol use: Yes    Alcohol/week: 2.0 standard drinks of alcohol    Types: 2 Standard drinks or equivalent per week    Comment: couple of times a month   Drug use: Never     Medication list has been reviewed and updated.  Current Meds  Medication Sig   esomeprazole (NEXIUM) 20 MG capsule Take 1 capsule by mouth as needed.    [DISCONTINUED] hydrochlorothiazide  (HYDRODIURIL ) 25 MG tablet TAKE 1 TABLET (25 MG TOTAL) BY MOUTH DAILY.   [DISCONTINUED] losartan  (COZAAR ) 50 MG tablet TAKE 1 TABLET BY MOUTH EVERY DAY       12/28/2023   10:32 AM 06/27/2023    2:54 PM 12/15/2022   10:35 AM 02/08/2022    8:55 AM  GAD 7 : Generalized Anxiety Score  Nervous, Anxious, on Edge 0 0 0 0  Control/stop worrying 0 0 0 0  Worry too much - different things 0 0 0 0  Trouble relaxing 0 0 0 0  Restless 0 0 0 0  Easily annoyed or irritable 0 0 0 0  Afraid - awful might happen 0 0 0 0  Total GAD 7 Score 0 0 0 0  Anxiety Difficulty Not difficult at all Not difficult at all Not difficult at all Not difficult at all       12/28/2023   10:32 AM 06/27/2023    2:54 PM 12/15/2022   10:35 AM  Depression screen PHQ 2/9  Decreased Interest 0 0 0  Down, Depressed, Hopeless 0 0 0  PHQ - 2 Score 0 0 0  Altered sleeping 0 0 1  Tired, decreased energy 0 0 0  Change in appetite 0 0 0  Feeling bad or failure about yourself  0 0 0  Trouble concentrating 0 0 0  Moving slowly or fidgety/restless 0 0 0  Suicidal thoughts 0 0 0  PHQ-9 Score 0 0 1  Difficult doing work/chores Not difficult at all Not difficult at all Not difficult at all    BP Readings from Last 3 Encounters:  12/28/23 122/78  06/27/23 128/80  12/15/22 128/78    Physical Exam Vitals and nursing note reviewed.  Constitutional:      General: She is not in acute distress.    Appearance: She is well-developed.  HENT:     Head: Normocephalic and atraumatic.     Right Ear: Tympanic membrane and ear canal normal.     Left  Ear: Tympanic membrane and ear canal normal.     Nose:     Right Sinus: No maxillary sinus tenderness.     Left Sinus: No maxillary sinus tenderness.  Eyes:     General: No scleral icterus.       Right eye: No discharge.        Left eye: No discharge.     Conjunctiva/sclera: Conjunctivae normal.  Neck:     Thyroid: No thyromegaly.     Vascular: No carotid bruit.  Cardiovascular:     Rate and Rhythm: Normal rate and regular rhythm.     Pulses: Normal pulses.     Heart sounds: Normal heart sounds.  Pulmonary:     Effort: Pulmonary effort is normal. No respiratory distress.     Breath sounds: No wheezing.  Abdominal:     General: Bowel sounds are normal.     Palpations: Abdomen is soft.     Tenderness: There is no abdominal tenderness.  Musculoskeletal:     Cervical back: Normal range of motion. No erythema.     Right lower leg: No edema.     Left lower leg: No edema.     Left ankle: Swelling present. Decreased range of motion.  Lymphadenopathy:     Cervical: No cervical adenopathy.  Skin:    General: Skin is warm and dry.     Findings: No rash.  Neurological:     Mental Status: She is alert and oriented to person, place, and time.     Cranial Nerves: No cranial nerve deficit.     Sensory: No sensory deficit.     Deep Tendon Reflexes: Reflexes are normal and symmetric.  Psychiatric:        Attention and Perception: Attention normal.        Mood and Affect: Mood normal.     Wt Readings from Last 3 Encounters:  12/28/23 186 lb (84.4 kg)  06/27/23 188 lb 9.6 oz (85.5 kg)  12/15/22 186 lb (84.4 kg)    BP 122/78   Pulse 96   Ht 5' 5 (1.651 m)   Wt 186 lb (84.4 kg)   SpO2 96%   BMI 30.95 kg/m   Assessment and Plan:  Problem List Items Addressed This Visit       Unprioritized   Essential (primary) hypertension (Chronic)   Blood pressure is well controlled.  Current medications losartan  and hydrochlorothiazide . Will continue same regimen along with efforts to  limit dietary sodium.       Relevant Medications   hydrochlorothiazide  (HYDRODIURIL ) 25 MG tablet   losartan  (COZAAR ) 50 MG tablet   Other Relevant Orders   CBC with Differential/Platelet   Comprehensive metabolic panel with GFR   TSH   Hyperlipidemia, mild (Chronic)   Managed with diet only.      Relevant Medications   hydrochlorothiazide  (HYDRODIURIL ) 25 MG tablet   losartan  (COZAAR ) 50 MG tablet   Other Relevant Orders   Lipid panel   Gastroesophageal reflux disease without esophagitis (Chronic)   Reflux symptoms are controlled on prn Nexium. Patient denies red flag symptoms - no melena, weight loss, dysphagia.       Relevant Orders   CBC with Differential/Platelet   Local edema   Having more swelling in the left ankle - see by Podiatry and attributed to flat foot and abnormal ankle mechanics - xray normal. Recommended compression sock, elevation, ice, anti inflammatories and orthotics.      Other Visit Diagnoses       Annual physical exam    -  Primary   Due for Tdap Prevnar next year at age 83   Relevant Orders   CBC with Differential/Platelet   Comprehensive metabolic panel with GFR   Hemoglobin A1c   Lipid panel   TSH     Encounter for screening mammogram for breast cancer       due in November at [facility] -  she has this scheduled.     Screening for diabetes mellitus       Relevant Orders   Hemoglobin A1c       Return in about 5 months (around 05/29/2024) for HTN.    Leita HILARIO Adie, MD [facility] Medicine [place]

## 2023-12-28 NOTE — Assessment & Plan Note (Signed)
 Having more swelling in the left ankle - see by Podiatry and attributed to flat foot and abnormal ankle mechanics - xray normal. Recommended compression sock, elevation, ice, anti inflammatories and orthotics.

## 2023-12-28 NOTE — Assessment & Plan Note (Addendum)
 Reflux symptoms are controlled on prn Nexium. Patient denies red flag symptoms - no melena, weight loss, dysphagia.

## 2023-12-28 NOTE — Assessment & Plan Note (Signed)
Managed with diet only.

## 2023-12-30 LAB — COMPREHENSIVE METABOLIC PANEL WITH GFR
ALT: 54 IU/L — ABNORMAL HIGH (ref 0–32)
AST: 45 IU/L — ABNORMAL HIGH (ref 0–40)
Albumin: 4.5 g/dL (ref 3.9–4.9)
Alkaline Phosphatase: 66 IU/L (ref 44–121)
BUN/Creatinine Ratio: 15 (ref 12–28)
BUN: 10 mg/dL (ref 8–27)
Bilirubin Total: 0.6 mg/dL (ref 0.0–1.2)
CO2: 21 mmol/L (ref 20–29)
Calcium: 9.5 mg/dL (ref 8.7–10.3)
Chloride: 102 mmol/L (ref 96–106)
Creatinine, Ser: 0.66 mg/dL (ref 0.57–1.00)
Globulin, Total: 2.9 g/dL (ref 1.5–4.5)
Glucose: 104 mg/dL — ABNORMAL HIGH (ref 70–99)
Potassium: 4.9 mmol/L (ref 3.5–5.2)
Sodium: 141 mmol/L (ref 134–144)
Total Protein: 7.4 g/dL (ref 6.0–8.5)
eGFR: 98 mL/min/1.73 (ref >59)

## 2023-12-30 LAB — CBC WITH DIFFERENTIAL/PLATELET
Basophils Absolute: 0 x10E3/uL (ref 0.0–0.2)
Basos: 1 %
EOS (ABSOLUTE): 0.3 x10E3/uL (ref 0.0–0.4)
Eos: 5 %
Hematocrit: 42.3 % (ref 34.0–46.6)
Hemoglobin: 14.2 g/dL (ref 11.1–15.9)
Immature Grans (Abs): 0 x10E3/uL (ref 0.0–0.1)
Immature Granulocytes: 0 %
Lymphocytes Absolute: 2.7 x10E3/uL (ref 0.7–3.1)
Lymphs: 41 %
MCH: 32.3 pg (ref 26.6–33.0)
MCHC: 33.6 g/dL (ref 31.5–35.7)
MCV: 96 fL (ref 79–97)
Monocytes Absolute: 0.6 x10E3/uL (ref 0.1–0.9)
Monocytes: 9 %
Neutrophils Absolute: 3 x10E3/uL (ref 1.4–7.0)
Neutrophils: 44 %
Platelets: 245 x10E3/uL (ref 150–450)
RBC: 4.4 x10E6/uL (ref 3.77–5.28)
RDW: 12 % (ref 11.7–15.4)
WBC: 6.6 x10E3/uL (ref 3.4–10.8)

## 2023-12-30 LAB — LIPID PANEL
Chol/HDL Ratio: 2.5 ratio (ref 0.0–4.4)
Cholesterol, Total: 242 mg/dL — ABNORMAL HIGH (ref 100–199)
HDL: 97 mg/dL (ref >39)
LDL Chol Calc (NIH): 121 mg/dL — ABNORMAL HIGH (ref 0–99)
Triglycerides: 140 mg/dL (ref 0–149)
VLDL Cholesterol Cal: 24 mg/dL (ref 5–40)

## 2023-12-30 LAB — HEMOGLOBIN A1C
Est. average glucose Bld gHb Est-mCnc: 111 mg/dL
Hgb A1c MFr Bld: 5.5 % (ref 4.8–5.6)

## 2023-12-30 LAB — TSH: TSH: 2.12 u[IU]/mL (ref 0.450–4.500)

## 2023-12-31 ENCOUNTER — Other Ambulatory Visit: Payer: Self-pay | Admitting: Internal Medicine

## 2023-12-31 ENCOUNTER — Ambulatory Visit: Payer: Self-pay | Admitting: Internal Medicine

## 2023-12-31 DIAGNOSIS — R7989 Other specified abnormal findings of blood chemistry: Secondary | ICD-10-CM

## 2023-12-31 NOTE — Progress Notes (Unsigned)
 Date:  12/31/2023   Name:  Sherri Cox   DOB:  21-Jan-1961   MRN:  [id number]   Chief Complaint: No chief complaint on file.  HPI  Review of Systems   Lab Results  Component Value Date   NA 141 12/29/2023   K 4.9 12/29/2023   CO2 21 12/29/2023   GLUCOSE 104 (H) 12/29/2023   BUN 10 12/29/2023   CREATININE 0.66 12/29/2023   CALCIUM 9.5 12/29/2023   EGFR 98 12/29/2023   GFRNONAA 93 08/05/2020   Lab Results  Component Value Date   CHOL 242 (H) 12/29/2023   HDL 97 12/29/2023   LDLCALC 121 (H) 12/29/2023   TRIG 140 12/29/2023   CHOLHDL 2.5 12/29/2023   Lab Results  Component Value Date   TSH 2.120 12/29/2023   Lab Results  Component Value Date   HGBA1C 5.5 12/29/2023   Lab Results  Component Value Date   WBC 6.6 12/29/2023   HGB 14.2 12/29/2023   HCT 42.3 12/29/2023   MCV 96 12/29/2023   PLT 245 12/29/2023   Lab Results  Component Value Date   ALT 54 (H) 12/29/2023   AST 45 (H) 12/29/2023   ALKPHOS 66 12/29/2023   BILITOT 0.6 12/29/2023   No results found for: MARIEN BOLLS, VD25OH   Patient Active Problem List   Diagnosis Date Noted   Tobacco use disorder, moderate, in sustained remission, dependence 12/15/2022   S/P total hysterectomy 08/04/2020   Gastroesophageal reflux disease without esophagitis 05/23/2018   Hyperlipidemia, mild 04/08/2015   Essential (primary) hypertension 03/04/2015   Local edema 03/04/2015   Abnormal LFTs 03/04/2015    No Known Allergies  Past Surgical History:  Procedure Laterality Date   TOTAL ABDOMINAL HYSTERECTOMY     Right Ovary remains   TUBAL LIGATION      Social History   Tobacco Use   Smoking status: Former    Current packs/day: 0.00    Average packs/day: 1 pack/day for 23.0 years (23.0 ttl pk-yrs)    Types: Cigarettes    Start date: 11/21/1977    Quit date: 11/21/2000    Years since quitting: 23.1   Smokeless tobacco: Never  Substance Use Topics   Alcohol use: Yes    Alcohol/week: 2.0  standard drinks of alcohol    Types: 2 Standard drinks or equivalent per week    Comment: couple of times a month   Drug use: Never     Medication list has been reviewed and updated.  No outpatient medications have been marked as taking for the 12/31/23 encounter (Orders Only) with Justus Leita DEL, MD.       12/28/2023   10:32 AM 06/27/2023    2:54 PM 12/15/2022   10:35 AM 02/08/2022    8:55 AM  GAD 7 : Generalized Anxiety Score  Nervous, Anxious, on Edge 0 0 0 0  Control/stop worrying 0 0 0 0  Worry too much - different things 0 0 0 0  Trouble relaxing 0 0 0 0  Restless 0 0 0 0  Easily annoyed or irritable 0 0 0 0  Afraid - awful might happen 0 0 0 0  Total GAD 7 Score 0 0 0 0  Anxiety Difficulty Not difficult at all Not difficult at all Not difficult at all Not difficult at all       12/28/2023   10:32 AM 06/27/2023    2:54 PM 12/15/2022   10:35 AM  Depression screen [facility] 2/9  Decreased Interest 0 0 0  Down, Depressed, Hopeless 0 0 0  PHQ - 2 Score 0 0 0  Altered sleeping 0 0 1  Tired, decreased energy 0 0 0  Change in appetite 0 0 0  Feeling bad or failure about yourself  0 0 0  Trouble concentrating 0 0 0  Moving slowly or fidgety/restless 0 0 0  Suicidal thoughts 0 0 0  PHQ-9 Score 0 0 1  Difficult doing work/chores Not difficult at all Not difficult at all Not difficult at all    BP Readings from Last 3 Encounters:  12/28/23 122/78  06/27/23 128/80  12/15/22 128/78    Physical Exam  Wt Readings from Last 3 Encounters:  12/28/23 186 lb (84.4 kg)  06/27/23 188 lb 9.6 oz (85.5 kg)  12/15/22 186 lb (84.4 kg)    There were no vitals taken for this visit.  Assessment and Plan:  Problem List Items Addressed This Visit   None   No follow-ups on file.    Leita HILARIO Adie, MD [facility] Medicine [place]

## 2024-01-01 NOTE — Progress Notes (Signed)
 Please review. Pt went to [facility] last time.  KP

## 2024-01-04 ENCOUNTER — Ambulatory Visit
Admission: RE | Admit: 2024-01-04 | Discharge: 2024-01-04 | Disposition: A | Discharge status: Home / Self Care | Source: Ambulatory Visit | Attending: Internal Medicine | Admitting: Internal Medicine

## 2024-01-04 DIAGNOSIS — R7989 Other specified abnormal findings of blood chemistry: Secondary | ICD-10-CM | POA: Insufficient documentation

## 2024-01-11 ENCOUNTER — Ambulatory Visit: Payer: Self-pay | Admitting: Internal Medicine

## 2024-02-28 ENCOUNTER — Other Ambulatory Visit: Payer: Self-pay | Admitting: Internal Medicine

## 2024-02-28 ENCOUNTER — Encounter: Payer: Self-pay | Admitting: Internal Medicine

## 2024-02-28 ENCOUNTER — Other Ambulatory Visit: Payer: Self-pay

## 2024-02-28 MED ORDER — COVID-19 MRNA VAC-TRIS(PFIZER) 30 MCG/0.3ML IM SUSY: 0.3000 mL | PREFILLED_SYRINGE | Freq: Once | INTRAMUSCULAR | 0 refills | Status: DC

## 2024-02-29 ENCOUNTER — Other Ambulatory Visit: Payer: Self-pay

## 2024-02-29 ENCOUNTER — Other Ambulatory Visit: Payer: Self-pay | Admitting: Internal Medicine

## 2024-02-29 DIAGNOSIS — Z23 Encounter for immunization: Secondary | ICD-10-CM

## 2024-02-29 MED ORDER — COVID-19 MRNA VACC (MODERNA) 50 MCG/0.5ML IM SUSY: 0.5000 mL | PREFILLED_SYRINGE | Freq: Once | INTRAMUSCULAR | 0 refills | Status: AC

## 2024-02-29 MED ORDER — COVID-19 MRNA VACC (MODERNA) 50 MCG/0.5ML IM SUSY: 0.5000 mL | PREFILLED_SYRINGE | Freq: Once | INTRAMUSCULAR | 0 refills | Status: DC

## 2024-02-29 NOTE — Progress Notes (Unsigned)
 Date:  02/29/2024   Name:  Sherri Cox   DOB:  [birth year]   MRN:  [id number]   Chief Complaint: No chief complaint on file.  HPI  Review of Systems   Lab Results  Component Value Date   NA 141 12/29/2023   K 4.9 12/29/2023   CO2 21 12/29/2023   GLUCOSE 104 (H) 12/29/2023   BUN 10 12/29/2023   CREATININE 0.66 12/29/2023   CALCIUM 9.5 12/29/2023   EGFR 98 12/29/2023   GFRNONAA 93 08/05/2020   Lab Results  Component Value Date   CHOL 242 (H) 12/29/2023   HDL 97 12/29/2023   LDLCALC 121 (H) 12/29/2023   TRIG 140 12/29/2023   CHOLHDL 2.5 12/29/2023   Lab Results  Component Value Date   TSH 2.120 12/29/2023   Lab Results  Component Value Date   HGBA1C 5.5 12/29/2023   Lab Results  Component Value Date   WBC 6.6 12/29/2023   HGB 14.2 12/29/2023   HCT 42.3 12/29/2023   MCV 96 12/29/2023   PLT 245 12/29/2023   Lab Results  Component Value Date   ALT 54 (H) 12/29/2023   AST 45 (H) 12/29/2023   ALKPHOS 66 12/29/2023   BILITOT 0.6 12/29/2023   No results found for: MARIEN BOLLS, VD25OH   Patient Active Problem List   Diagnosis Date Noted   Tobacco use disorder, moderate, in sustained remission, dependence 12/15/2022   S/P total hysterectomy 08/04/2020   Gastroesophageal reflux disease without esophagitis 05/23/2018   Hyperlipidemia, mild 04/08/2015   Essential (primary) hypertension 03/04/2015   Local edema 03/04/2015   Abnormal LFTs 03/04/2015    No Known Allergies  Past Surgical History:  Procedure Laterality Date   TOTAL ABDOMINAL HYSTERECTOMY     Right Ovary remains   TUBAL LIGATION      Social History   Tobacco Use   Smoking status: Former    Current packs/day: 0.00    Average packs/day: 1 pack/day for 23.0 years (23.0 ttl pk-yrs)    Types: Cigarettes    Start date: 11/21/1977    Quit date: 11/21/2000    Years since quitting: 23.2   Smokeless tobacco: Never  Substance Use Topics   Alcohol use: Yes    Alcohol/week: 2.0  standard drinks of alcohol    Types: 2 Standard drinks or equivalent per week    Comment: couple of times a month   Drug use: Never     Medication list has been reviewed and updated.  No outpatient medications have been marked as taking for the 02/29/24 encounter (Orders Only) with Justus Leita DEL, MD.       12/28/2023   10:32 AM 06/27/2023    2:54 PM 12/15/2022   10:35 AM 02/08/2022    8:55 AM  GAD 7 : Generalized Anxiety Score  Nervous, Anxious, on Edge 0 0 0 0  Control/stop worrying 0 0 0 0  Worry too much - different things 0 0 0 0  Trouble relaxing 0 0 0 0  Restless 0 0 0 0  Easily annoyed or irritable 0 0 0 0  Afraid - awful might happen 0 0 0 0  Total GAD 7 Score 0 0 0 0  Anxiety Difficulty Not difficult at all Not difficult at all Not difficult at all Not difficult at all       12/28/2023   10:32 AM 06/27/2023    2:54 PM 12/15/2022   10:35 AM  Depression screen [facility] 2/9  Decreased Interest 0 0 0  Down, Depressed, Hopeless 0 0 0  PHQ - 2 Score 0 0 0  Altered sleeping 0 0 1  Tired, decreased energy 0 0 0  Change in appetite 0 0 0  Feeling bad or failure about yourself  0 0 0  Trouble concentrating 0 0 0  Moving slowly or fidgety/restless 0 0 0  Suicidal thoughts 0 0 0  PHQ-9 Score 0 0 1  Difficult doing work/chores Not difficult at all Not difficult at all Not difficult at all    BP Readings from Last 3 Encounters:  12/28/23 122/78  06/27/23 128/80  12/15/22 128/78    Physical Exam  Wt Readings from Last 3 Encounters:  12/28/23 186 lb (84.4 kg)  06/27/23 188 lb 9.6 oz (85.5 kg)  12/15/22 186 lb (84.4 kg)    There were no vitals taken for this visit.  Assessment and Plan:  Problem List Items Addressed This Visit   None   No follow-ups on file.    Leita HILARIO Adie, MD [facility] Primary Care and Sports Medicine [place]

## 2024-02-29 NOTE — Telephone Encounter (Signed)
 Requested medications are due for refill today.  See note  Requested medications are on the active medications list.  yes  Last refill. 02/29/2024  Future visit scheduled.   yes  Notes to clinic.  Pharmacy comment: Script Clarification:WE ONLY HAVE MODERNA AT THIS LOCATION.     Requested Prescriptions  Pending Prescriptions Disp Refills   PFIZERPEN [id number] units injection [Pharmacy Med Name: PFIZERPEN 20 MILLION UNIT VIAL]  0    Sig: INJECT 0.3 ML INTO THE MUSCLE ONCE FOR 1 DOSE     Off-Protocol Failed - 02/29/2024  3:16 PM      Failed - Medication not assigned to a protocol, review manually.      Passed - Valid encounter within last 12 months    Recent Outpatient Visits           2 months ago Annual physical exam   [facility] at [facility], Leita DEL, MD

## 2024-05-07 ENCOUNTER — Encounter: Payer: Self-pay | Admitting: Internal Medicine

## 2024-05-07 LAB — HM MAMMOGRAPHY

## 2024-05-22 ENCOUNTER — Encounter: Payer: Self-pay | Admitting: Internal Medicine

## 2024-05-23 ENCOUNTER — Ambulatory Visit: Admitting: Internal Medicine

## 2024-05-23 ENCOUNTER — Encounter: Payer: Self-pay | Admitting: Internal Medicine

## 2024-05-23 VITALS — BP 128/82 | Ht 65.0 in | Wt 184.0 lb

## 2024-05-23 DIAGNOSIS — I10 Essential (primary) hypertension: Secondary | ICD-10-CM

## 2024-05-23 DIAGNOSIS — K7581 Nonalcoholic steatohepatitis (NASH): Secondary | ICD-10-CM

## 2024-05-23 NOTE — Progress Notes (Signed)
 Date:  05/23/2024   Name:  Sherri Cox   DOB:  [birth year]   MRN:  [id number]   Chief Complaint: No chief complaint on file.  Hypertension This is a chronic problem. The problem is controlled. Pertinent negatives include no chest pain, headaches, palpitations or shortness of breath. Past treatments include angiotensin blockers and diuretics. The current treatment provides significant improvement.  Hepatic steatosis - seen on US  with mild increase in LFTs.  She is working on low fat diet, has lost 4 lbs.    Review of Systems  Constitutional:  Negative for fatigue and unexpected weight change.  HENT:  Negative for trouble swallowing.   Eyes:  Negative for visual disturbance.  Respiratory:  Negative for cough, chest tightness, shortness of breath and wheezing.   Cardiovascular:  Negative for chest pain, palpitations and leg swelling.  Gastrointestinal:  Negative for abdominal pain, constipation and diarrhea.  Musculoskeletal:  Negative for arthralgias and myalgias.  Neurological:  Negative for dizziness, weakness, light-headedness and headaches.     Lab Results  Component Value Date   NA 141 12/29/2023   K 4.9 12/29/2023   CO2 21 12/29/2023   GLUCOSE 104 (H) 12/29/2023   BUN 10 12/29/2023   CREATININE 0.66 12/29/2023   CALCIUM 9.5 12/29/2023   EGFR 98 12/29/2023   GFRNONAA 93 08/05/2020   Lab Results  Component Value Date   CHOL 242 (H) 12/29/2023   HDL 97 12/29/2023   LDLCALC 121 (H) 12/29/2023   TRIG 140 12/29/2023   CHOLHDL 2.5 12/29/2023   Lab Results  Component Value Date   TSH 2.120 12/29/2023   Lab Results  Component Value Date   HGBA1C 5.5 12/29/2023   Lab Results  Component Value Date   WBC 6.6 12/29/2023   HGB 14.2 12/29/2023   HCT 42.3 12/29/2023   MCV 96 12/29/2023   PLT 245 12/29/2023   Lab Results  Component Value Date   ALT 54 (H) 12/29/2023   AST 45 (H) 12/29/2023   ALKPHOS 66 12/29/2023   BILITOT 0.6 12/29/2023   No results found for:  MARIEN BOLLS, VD25OH   Patient Active Problem List   Diagnosis Date Noted   Tobacco use disorder, moderate, in sustained remission, dependence 12/15/2022   S/P total hysterectomy 08/04/2020   Gastroesophageal reflux disease without esophagitis 05/23/2018   Hyperlipidemia, mild 04/08/2015   Essential (primary) hypertension 03/04/2015   Local edema 03/04/2015   Steatohepatitis, non-alcoholic 03/04/2015    No Known Allergies  Past Surgical History:  Procedure Laterality Date   TOTAL ABDOMINAL HYSTERECTOMY     Right Ovary remains   TUBAL LIGATION      Social History   Tobacco Use   Smoking status: Former    Current packs/day: 0.00    Average packs/day: 1 pack/day for 23.0 years (23.0 ttl pk-yrs)    Types: Cigarettes    Start date: 11/21/1977    Quit date: 11/21/2000    Years since quitting: 23.5   Smokeless tobacco: Never  Substance Use Topics   Alcohol use: Yes    Alcohol/week: 2.0 standard drinks of alcohol    Types: 2 Standard drinks or equivalent per week    Comment: couple of times a month   Drug use: Never     Medication list has been reviewed and updated.  Current Meds  Medication Sig   esomeprazole (NEXIUM) 20 MG capsule Take 1 capsule by mouth as needed.    hydrochlorothiazide  (HYDRODIURIL ) 25 MG tablet Take 1  tablet (25 mg total) by mouth daily.   losartan  (COZAAR ) 50 MG tablet Take 1 tablet (50 mg total) by mouth daily.       05/23/2024   10:39 AM 12/28/2023   10:32 AM 06/27/2023    2:54 PM 12/15/2022   10:35 AM  GAD 7 : Generalized Anxiety Score  Nervous, Anxious, on Edge 0 0 0 0  Control/stop worrying 0 0 0 0  Worry too much - different things 0 0 0 0  Trouble relaxing 0 0 0 0  Restless 0 0 0 0  Easily annoyed or irritable 0 0 0 0  Afraid - awful might happen 0 0 0 0  Total GAD 7 Score 0 0 0 0  Anxiety Difficulty  Not difficult at all Not difficult at all Not difficult at all       05/23/2024   10:39 AM 12/28/2023   10:32 AM 06/27/2023     2:54 PM  Depression screen PHQ 2/9  Decreased Interest 0 0 0  Down, Depressed, Hopeless 0 0 0  PHQ - 2 Score 0 0 0  Altered sleeping 0 0 0  Tired, decreased energy 0 0 0  Change in appetite 0 0 0  Feeling bad or failure about yourself  0 0 0  Trouble concentrating 0 0 0  Moving slowly or fidgety/restless 0 0 0  Suicidal thoughts 0 0 0  PHQ-9 Score 0 0  0   Difficult doing work/chores  Not difficult at all Not difficult at all     Data saved with a previous flowsheet row definition    BP Readings from Last 3 Encounters:  05/23/24 128/82  12/28/23 122/78  06/27/23 128/80    Physical Exam Vitals and nursing note reviewed.  Constitutional:      General: She is not in acute distress.    Appearance: She is well-developed.  HENT:     Head: Normocephalic and atraumatic.  Pulmonary:     Effort: Pulmonary effort is normal. No respiratory distress.  Skin:    General: Skin is warm and dry.     Findings: No rash.  Neurological:     Mental Status: She is alert and oriented to person, place, and time.  Psychiatric:        Mood and Affect: Mood normal.        Behavior: Behavior normal.     Wt Readings from Last 3 Encounters:  05/23/24 184 lb (83.5 kg)  12/28/23 186 lb (84.4 kg)  06/27/23 188 lb 9.6 oz (85.5 kg)    BP 128/82   Ht 5' 5 (1.651 m)   Wt 184 lb (83.5 kg)   BMI 30.62 kg/m   Assessment and Plan:  Problem List Items Addressed This Visit       Unprioritized   Essential (primary) hypertension - Primary (Chronic)   Well controlled blood pressure today. Current regimen is losartan  and hydrochlorothiazide . No medication side effects noted.        Steatohepatitis, non-alcoholic (Chronic)   She is working on diet and has lost about 5 lbs. US  showed on mild steatosis.      F/u July for CPX with Dr. Lemon. No follow-ups on file.    Leita HILARIO Adie, MD [facility] [place]

## 2024-05-23 NOTE — Assessment & Plan Note (Signed)
 She is working on diet and has lost about 5 lbs. US  showed on mild steatosis.

## 2024-05-23 NOTE — Assessment & Plan Note (Signed)
 Well controlled blood pressure today. Current regimen is losartan  and hydrochlorothiazide . No medication side effects noted.

## 2025-01-02 ENCOUNTER — Encounter: Admitting: Student
